# Patient Record
Sex: Male | Born: 1985 | Race: White | Hispanic: No | Marital: Married
Health system: Southern US, Community
[De-identification: ages and names within clinical notes are randomized; demographics above are authoritative.]

## PROBLEM LIST (undated history)

## (undated) DIAGNOSIS — R29898 Other symptoms and signs involving the musculoskeletal system: Secondary | ICD-10-CM

## (undated) DIAGNOSIS — I712 Thoracic aortic aneurysm, without rupture: Secondary | ICD-10-CM

## (undated) DIAGNOSIS — R519 Headache, unspecified: Secondary | ICD-10-CM

## (undated) DIAGNOSIS — K224 Dyskinesia of esophagus: Secondary | ICD-10-CM

## (undated) DIAGNOSIS — I7101 Dissection of thoracic aorta: Secondary | ICD-10-CM

## (undated) DIAGNOSIS — I71012 Dissection of descending thoracic aorta: Secondary | ICD-10-CM

## (undated) DIAGNOSIS — R51 Headache: Secondary | ICD-10-CM

## (undated) HISTORY — DX: Thoracic aortic aneurysm, without rupture: I71.2

## (undated) HISTORY — DX: Dissection of thoracic aorta: I71.01

## (undated) HISTORY — PX: COLONOSCOPY WITH ESOPHAGOGASTRODUODENOSCOPY (EGD): SHX5779

## (undated) HISTORY — DX: Headache, unspecified: R51.9

## (undated) HISTORY — DX: Dissection of descending thoracic aorta: I71.012

## (undated) HISTORY — DX: Other symptoms and signs involving the musculoskeletal system: R29.898

## (undated) HISTORY — DX: Dyskinesia of esophagus: K22.4

## (undated) HISTORY — DX: Headache: R51

---
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
## 2006-05-20 ENCOUNTER — Emergency Department (HOSPITAL_COMMUNITY): Admission: EM | Admit: 2006-05-20 | Discharge: 2006-05-20 | Payer: Self-pay | Admitting: Emergency Medicine

## 2009-01-03 HISTORY — PX: SHOULDER ARTHROSCOPY: SHX128

## 2009-05-11 ENCOUNTER — Encounter: Admission: RE | Admit: 2009-05-11 | Discharge: 2009-05-11 | Payer: Self-pay | Admitting: Cardiovascular Disease

## 2010-05-10 ENCOUNTER — Emergency Department (HOSPITAL_COMMUNITY)
Admission: EM | Admit: 2010-05-10 | Discharge: 2010-05-10 | Discharge status: Against Medical Advice | Payer: BC Managed Care – PPO | Attending: Emergency Medicine | Admitting: Emergency Medicine

## 2010-05-17 ENCOUNTER — Other Ambulatory Visit: Payer: Self-pay | Admitting: Family Medicine

## 2010-05-17 ENCOUNTER — Ambulatory Visit
Admission: RE | Admit: 2010-05-17 | Discharge: 2010-05-17 | Disposition: A | Discharge status: Home / Self Care | Payer: BC Managed Care – PPO | Source: Ambulatory Visit | Attending: Family Medicine | Admitting: Family Medicine

## 2010-05-17 MED ORDER — IOHEXOL 300 MG/ML  SOLN
100.0000 mL | Freq: Once | INTRAMUSCULAR | Status: AC | PRN
  Administered 2010-05-17: 100 mL via INTRAVENOUS

## 2010-05-22 ENCOUNTER — Emergency Department (HOSPITAL_COMMUNITY): Payer: BC Managed Care – PPO

## 2010-05-22 ENCOUNTER — Emergency Department (HOSPITAL_COMMUNITY)
Admission: EM | Admit: 2010-05-22 | Discharge: 2010-05-23 | Disposition: A | Discharge status: Home / Self Care | Payer: BC Managed Care – PPO | Attending: Emergency Medicine | Admitting: Emergency Medicine

## 2010-05-22 DIAGNOSIS — R10813 Right lower quadrant abdominal tenderness: Secondary | ICD-10-CM | POA: Insufficient documentation

## 2010-05-22 DIAGNOSIS — R112 Nausea with vomiting, unspecified: Secondary | ICD-10-CM | POA: Insufficient documentation

## 2010-05-22 DIAGNOSIS — R109 Unspecified abdominal pain: Secondary | ICD-10-CM | POA: Insufficient documentation

## 2010-05-22 LAB — BASIC METABOLIC PANEL
BUN: 6 mg/dL (ref 6–23)
CO2: 25 mEq/L (ref 19–32)
Calcium: 9.8 mg/dL (ref 8.4–10.5)
Chloride: 99 mEq/L (ref 96–112)
Creatinine, Ser: 0.85 mg/dL (ref 0.4–1.5)
GFR calc Af Amer: >60 mL/min (ref >60)
GFR calc non Af Amer: >60 mL/min (ref >60)
Glucose, Bld: 116 mg/dL — ABNORMAL HIGH (ref 70–99)
Potassium: 3.7 mEq/L (ref 3.5–5.1)
Sodium: 135 mEq/L (ref 135–145)

## 2010-05-22 LAB — URINALYSIS, ROUTINE W REFLEX MICROSCOPIC
Bilirubin Urine: NEGATIVE
Glucose, UA: NEGATIVE mg/dL
Hgb urine dipstick: NEGATIVE
Ketones, ur: 40 mg/dL — AB
Nitrite: NEGATIVE
Protein, ur: NEGATIVE mg/dL
Specific Gravity, Urine: 1.02 (ref 1.005–1.030)
Urobilinogen, UA: 0.2 mg/dL (ref 0.0–1.0)
pH: 6 (ref 5.0–8.0)

## 2010-05-22 LAB — HEPATIC FUNCTION PANEL
ALT: 25 U/L (ref 0–53)
AST: 24 U/L (ref 0–37)
Albumin: 4.5 g/dL (ref 3.5–5.2)
Alkaline Phosphatase: 101 U/L (ref 39–117)
Bilirubin, Direct: 0.1 mg/dL (ref 0.0–0.3)
Indirect Bilirubin: 0.2 mg/dL — ABNORMAL LOW (ref 0.3–0.9)
Total Bilirubin: 0.3 mg/dL (ref 0.3–1.2)
Total Protein: 8.1 g/dL (ref 6.0–8.3)

## 2010-05-22 LAB — DIFFERENTIAL
Basophils Absolute: 0 10*3/uL (ref 0.0–0.1)
Basophils Relative: 0 % (ref 0–1)
Eosinophils Absolute: 0 10*3/uL (ref 0.0–0.7)
Eosinophils Relative: 0 % (ref 0–5)
Lymphocytes Relative: 6 % — ABNORMAL LOW (ref 12–46)
Lymphs Abs: 1 10*3/uL (ref 0.7–4.0)
Monocytes Absolute: 0.3 10*3/uL (ref 0.1–1.0)
Monocytes Relative: 2 % — ABNORMAL LOW (ref 3–12)
Neutro Abs: 15.4 10*3/uL — ABNORMAL HIGH (ref 1.7–7.7)
Neutrophils Relative %: 92 % — ABNORMAL HIGH (ref 43–77)

## 2010-05-22 LAB — CBC
HCT: 40.5 % (ref 39.0–52.0)
Hemoglobin: 13 g/dL (ref 13.0–17.0)
MCH: 22.4 pg — ABNORMAL LOW (ref 26.0–34.0)
MCHC: 32.1 g/dL (ref 30.0–36.0)
MCV: 69.8 fL — ABNORMAL LOW (ref 78.0–100.0)
Platelets: 429 10*3/uL — ABNORMAL HIGH (ref 150–400)
RBC: 5.8 MIL/uL (ref 4.22–5.81)
RDW: 15.7 % — ABNORMAL HIGH (ref 11.5–15.5)
WBC: 16.7 10*3/uL — ABNORMAL HIGH (ref 4.0–10.5)

## 2010-05-22 LAB — LIPASE, BLOOD: Lipase: 30 U/L (ref 11–59)

## 2010-05-22 MED ORDER — IOHEXOL 300 MG/ML  SOLN
100.0000 mL | Freq: Once | INTRAMUSCULAR | Status: AC | PRN
  Administered 2010-05-22: 100 mL via INTRAVENOUS

## 2010-05-24 ENCOUNTER — Other Ambulatory Visit (HOSPITAL_COMMUNITY): Payer: Self-pay | Admitting: Gastroenterology

## 2010-05-24 DIAGNOSIS — R112 Nausea with vomiting, unspecified: Secondary | ICD-10-CM

## 2010-05-25 LAB — GC/CHLAMYDIA PROBE AMP, GENITAL
Chlamydia, DNA Probe: NEGATIVE
GC Probe Amp, Genital: NEGATIVE

## 2010-06-02 LAB — HM COLONOSCOPY

## 2010-06-04 ENCOUNTER — Encounter (HOSPITAL_COMMUNITY)
Admission: RE | Admit: 2010-06-04 | Discharge: 2010-06-04 | Disposition: A | Discharge status: Home / Self Care | Payer: BC Managed Care – PPO | Source: Ambulatory Visit | Attending: Gastroenterology | Admitting: Gastroenterology

## 2010-06-04 DIAGNOSIS — R1013 Epigastric pain: Secondary | ICD-10-CM | POA: Insufficient documentation

## 2010-06-04 DIAGNOSIS — K3189 Other diseases of stomach and duodenum: Secondary | ICD-10-CM | POA: Insufficient documentation

## 2010-06-04 DIAGNOSIS — R112 Nausea with vomiting, unspecified: Secondary | ICD-10-CM

## 2010-06-04 MED ORDER — TECHNETIUM TC 99M SULFUR COLLOID
2.0000 | Freq: Once | INTRAVENOUS | Status: AC | PRN
  Administered 2010-06-04: 2 via INTRAVENOUS

## 2010-11-29 ENCOUNTER — Other Ambulatory Visit: Payer: Self-pay | Admitting: Family Medicine

## 2010-11-29 ENCOUNTER — Ambulatory Visit
Admission: RE | Admit: 2010-11-29 | Discharge: 2010-11-29 | Disposition: A | Discharge status: Home / Self Care | Payer: BC Managed Care – PPO | Source: Ambulatory Visit | Attending: Family Medicine | Admitting: Family Medicine

## 2010-11-29 DIAGNOSIS — M25511 Pain in right shoulder: Secondary | ICD-10-CM

## 2011-05-26 ENCOUNTER — Encounter: Payer: Self-pay | Admitting: Cardiology

## 2015-02-21 DIAGNOSIS — Y998 Other external cause status: Secondary | ICD-10-CM | POA: Insufficient documentation

## 2015-02-21 DIAGNOSIS — S93402A Sprain of unspecified ligament of left ankle, initial encounter: Secondary | ICD-10-CM | POA: Insufficient documentation

## 2015-02-21 DIAGNOSIS — Y9289 Other specified places as the place of occurrence of the external cause: Secondary | ICD-10-CM | POA: Diagnosis not present

## 2015-02-21 DIAGNOSIS — Z8679 Personal history of other diseases of the circulatory system: Secondary | ICD-10-CM | POA: Diagnosis not present

## 2015-02-21 DIAGNOSIS — X501XXA Overexertion from prolonged static or awkward postures, initial encounter: Secondary | ICD-10-CM | POA: Insufficient documentation

## 2015-02-21 DIAGNOSIS — Y9389 Activity, other specified: Secondary | ICD-10-CM | POA: Insufficient documentation

## 2015-02-21 DIAGNOSIS — S99912A Unspecified injury of left ankle, initial encounter: Secondary | ICD-10-CM | POA: Diagnosis present

## 2015-02-21 NOTE — ED Notes (Signed)
Patient presents with left ankle swollen.  Patient stated he missed a couple of steps and hurt his ankle  Ice applied in triage

## 2015-02-22 ENCOUNTER — Encounter (HOSPITAL_COMMUNITY): Payer: Self-pay | Admitting: *Deleted

## 2015-02-22 ENCOUNTER — Emergency Department (HOSPITAL_COMMUNITY): Payer: BLUE CROSS/BLUE SHIELD

## 2015-02-22 ENCOUNTER — Emergency Department (HOSPITAL_COMMUNITY)
Admission: EM | Admit: 2015-02-22 | Discharge: 2015-02-22 | Disposition: A | Discharge status: Home / Self Care | Payer: BLUE CROSS/BLUE SHIELD | Attending: Emergency Medicine | Admitting: Emergency Medicine

## 2015-02-22 DIAGNOSIS — S93402A Sprain of unspecified ligament of left ankle, initial encounter: Secondary | ICD-10-CM

## 2015-02-22 MED ORDER — IBUPROFEN 100 MG/5ML PO SUSP: 600.0000 mg | Freq: Four times a day (QID) | ORAL | Status: AC | PRN

## 2015-02-22 MED ORDER — IBUPROFEN 100 MG/5ML PO SUSP
600.0000 mg | Freq: Once | ORAL | Status: AC
  Administered 2015-02-22: 600 mg via ORAL
  Filled 2015-02-22: qty 30

## 2015-02-22 NOTE — Discharge Instructions (Signed)
Ankle Sprain °An ankle sprain is an injury to the strong, fibrous tissues (ligaments) that hold the bones of your ankle joint together.  °CAUSES °An ankle sprain is usually caused by a fall or by twisting your ankle. Ankle sprains most commonly occur when you step on the outer edge of your foot, and your ankle turns inward. People who participate in sports are more prone to these types of injuries.  °SYMPTOMS  °· Pain in your ankle. The pain may be present at rest or only when you are trying to stand or walk. °· Swelling. °· Bruising. Bruising may develop immediately or within 1 to 2 days after your injury. °· Difficulty standing or walking, particularly when turning corners or changing directions. °DIAGNOSIS  °Your caregiver will ask you details about your injury and perform a physical exam of your ankle to determine if you have an ankle sprain. During the physical exam, your caregiver will press on and apply pressure to specific areas of your foot and ankle. Your caregiver will try to move your ankle in certain ways. An X-ray exam may be done to be sure a bone was not broken or a ligament did not separate from one of the bones in your ankle (avulsion fracture).  °TREATMENT  °Certain types of braces can help stabilize your ankle. Your caregiver can make a recommendation for this. Your caregiver may recommend the use of medicine for pain. If your sprain is severe, your caregiver may refer you to a surgeon who helps to restore function to parts of your skeletal system (orthopedist) or a physical therapist. °HOME CARE INSTRUCTIONS  °· Apply ice to your injury for 1-2 days or as directed by your caregiver. Applying ice helps to reduce inflammation and pain. °· Put ice in a plastic bag. °· Place a towel between your skin and the bag. °· Leave the ice on for 15-20 minutes at a time, every 2 hours while you are awake. °· Only take over-the-counter or prescription medicines for pain, discomfort, or fever as directed by  your caregiver. °· Elevate your injured ankle above the level of your heart as much as possible for 2-3 days. °· If your caregiver recommends crutches, use them as instructed. Gradually put weight on the affected ankle. Continue to use crutches or a cane until you can walk without feeling pain in your ankle. °· If you have a plaster splint, wear the splint as directed by your caregiver. Do not rest it on anything harder than a pillow for the first 24 hours. Do not put weight on it. Do not get it wet. You may take it off to take a shower or bath. °· You may have been given an elastic bandage to wear around your ankle to provide support. If the elastic bandage is too tight (you have numbness or tingling in your foot or your foot becomes cold and blue), adjust the bandage to make it comfortable. °· If you have an air splint, you may blow more air into it or let air out to make it more comfortable. You may take your splint off at night and before taking a shower or bath. Wiggle your toes in the splint several times per day to decrease swelling. °SEEK MEDICAL CARE IF:  °· You have rapidly increasing bruising or swelling. °· Your toes feel extremely cold or you lose feeling in your foot. °· Your pain is not relieved with medicine. °SEEK IMMEDIATE MEDICAL CARE IF: °· Your toes are numb or blue. °·   You have severe pain that is increasing. °MAKE SURE YOU:  °· Understand these instructions. °· Will watch your condition. °· Will get help right away if you are not doing well or get worse. °  °This information is not intended to replace advice given to you by your health care provider. Make sure you discuss any questions you have with your health care provider. °  °Document Released: 12/20/2004 Document Revised: 01/10/2014 Document Reviewed: 01/01/2011 °Elsevier Interactive Patient Education ©2016 [employer] °RICE for Routine Care of Injuries °Many injuries can be cared for using rest, ice, compression, and elevation (RICE  therapy). Using RICE therapy can help to lessen pain and swelling. It can help your body to heal. °Rest °Reduce your normal activities and avoid using the injured part of your body. You can go back to your normal activities when you feel okay and your doctor says it is okay. °Ice °Do not put ice on your bare skin. °· Put ice in a plastic bag. °· Place a towel between your skin and the bag. °· Leave the ice on for 20 minutes, 2-3 times a day. °Do this for as long as told by your doctor. °Compression °Compression means putting pressure on the injured area. This can be done with an elastic bandage. If an elastic bandage has been applied: °· Remove and reapply the bandage every 3-4 hours or as told by your doctor. °· Make sure the bandage is not wrapped too tight. Wrap the bandage more loosely if part of your body beyond the bandage is blue, swollen, cold, painful, or loses feeling (numb). °· See your doctor if the bandage seems to make your problems worse. °Elevation °Elevation means keeping the injured area raised. Raise the injured area above your heart or the center of your chest if you can. °WHEN SHOULD I GET HELP? °You should get help if: °· You keep having pain and swelling. °· Your symptoms get worse. °WHEN SHOULD I GET HELP RIGHT AWAY? °You should get help right away if: °· You have sudden bad pain at or below the area of your injury. °· You have redness or more swelling around your injury. °· You have tingling or numbness at or below the injury that does not go away when you take off the bandage. °  °This information is not intended to replace advice given to you by your health care provider. Make sure you discuss any questions you have with your health care provider. °  °Document Released: 06/08/2007 Document Revised: 09/10/2014 Document Reviewed: 11/27/2013 °Elsevier Interactive Patient Education ©2016 [employer] ° °

## 2015-02-22 NOTE — ED Notes (Signed)
Patient is alert and orientedx4.  Patient was explained discharge instructions and they understood them with no questions.  The patient's wife, Hayk Divis is taking the patient home.

## 2015-02-22 NOTE — ED Provider Notes (Signed)
CSN: [id number]     Arrival date & time 02/21/15  2352 History   First MD Initiated Contact with Patient 02/22/15 0058     Chief Complaint  Patient presents with  . Ankle Injury     (Consider location/radiation/quality/duration/timing/severity/associated sxs/prior Treatment) Patient is a 30 y.o. male presenting with foot injury.  Foot Injury Location:  Ankle Time since incident: a few hours ago. Injury: yes   Mechanism of injury comment:  "missed a couple steps" twisting ankle Ankle location:  L ankle Pain details:    Quality:  Throbbing   Radiates to:  Does not radiate   Severity:  Mild   Onset quality:  Sudden   Timing:  Constant   Progression:  Waxing and waning Chronicity:  New Relieved by:  Nothing Worsened by:  Bearing weight Ineffective treatments:  Ice Associated symptoms: swelling   Associated symptoms: no decreased ROM, no fever, no muscle weakness, no numbness and no tingling   Risk factors: no frequent fractures and no known bone disorder     Past Medical History  Diagnosis Date  . 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 Left arm weakness   . Generalized headaches   . 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 Aortic transection     Family History  Problem Relation Age of Onset  . Hypertension Father   . Hyperlipidemia Mother    Social History  Substance Use Topics  . Smoking status: Never Smoker   . Smokeless tobacco: Never Used  . Alcohol Use: Yes     Comment: Social.     Review of Systems  Constitutional: Negative for fever.  Musculoskeletal: Positive for joint swelling and arthralgias.  All other systems reviewed and are negative.   Allergies  Review of patient's allergies indicates no known allergies.  Home Medications   Prior to Admission medications   Medication Sig Start Date End Date Taking? Authorizing Provider  ibuprofen (CHILD IBUPROFEN) 100 MG/5ML  suspension Take 30 mLs (600 mg total) by mouth every 6 (six) hours as needed for mild pain or moderate pain. 02/22/15   Antony Madura, PA-C  Multiple Vitamins-Minerals (MULTIVITAMIN PO) Take 1 tablet by mouth as needed.    Historical Provider, MD   BP 139/76 mmHg  Pulse 94  Temp(Src) 98.9 F (37.2 C) (Oral)  Resp 18  Ht  (1.727 m)  Wt 84.823 kg  BMI 28.44 kg/m2  SpO2 96%   Physical Exam  Constitutional: He is oriented to person, place, and time. He appears well-developed and well-nourished. No distress.  Nontoxic/nonseptic appearing  HENT:  Head: Normocephalic and atraumatic.  Eyes: Conjunctivae and EOM are normal. No scleral icterus.  Neck: Normal range of motion.  Cardiovascular: Normal rate, regular rhythm and intact distal pulses.   DP and PT pulses 2+ in the LLE. Capillary refill brisk in all digits of L foot.  Pulmonary/Chest: Effort normal. No respiratory distress.  Musculoskeletal: Normal range of motion.       Left ankle: He exhibits swelling. He exhibits normal range of motion, no deformity and normal pulse. Tenderness. Lateral malleolus tenderness found. Achilles tendon normal.       Feet:  Neurological: He is alert and oriented to person, place, and time. He exhibits normal muscle tone. Coordination normal.  Skin: Skin is warm and dry. No rash noted. He is not diaphoretic. No erythema. No pallor.  Psychiatric: He has a normal  mood and affect. His behavior is normal.  Nursing note and vitals reviewed.   ED Course  Procedures (including critical care time) Labs Review Labs Reviewed - No data to display  Imaging Review Dg Ankle 2 Views Left  02/22/2015  CLINICAL DATA:  Patient tripped on steps and hurt the left ankle. Swelling of the left ankle. EXAM: LEFT ANKLE - 2 VIEW COMPARISON:  None. FINDINGS: Soft tissue swelling predominate about the lateral malleolus. No evidence of acute fracture or dislocation demonstrated on two view study. No focal bone lesions. No  radiopaque soft tissue foreign bodies. IMPRESSION: Lateral soft tissue swelling.  No acute fractures demonstrated. Electronically Signed   By: Burman Nieves M.D.   On: 02/22/2015 00:54   I have personally reviewed and evaluated these images and lab results as part of my medical decision-making.   EKG Interpretation None      MDM   Final diagnoses:  Ankle sprain, left, initial encounter    30 year old male patient to the ED for evaluation of left ankle pain after twisting his ankle. Patient smells of alcohol. He is neurovascularly intact. X-ray negative for fracture. Patient placed in ASO ankle brace and given crutches for WBAT. Have advised the use of NSAIDs and icing as well as elevation. Orthopedic referral provided should pain persist despite supportive care. Return precautions given at discharge. Patient agreeable to plan with known address concerns; discharged in good condition in the care of sober male.   Filed Vitals:   02/21/15 2359 02/22/15 0137  BP: 124/70 139/76  Pulse: 67 94  Temp: 98.9 F (37.2 C) 98.9 F (37.2 C)  TempSrc: Oral Oral  Resp: 20 18  Height:  (1.727 m)   Weight: 84.823 kg   SpO2: 100% 96%     Antony Madura, PA-C 02/22/15 0981  Shon Baton, MD 02/22/15 2302

## 2016-11-10 ENCOUNTER — Other Ambulatory Visit (INDEPENDENT_AMBULATORY_CARE_PROVIDER_SITE_OTHER): Payer: Self-pay

## 2016-11-10 MED ORDER — METHYLPREDNISOLONE 4 MG PO TABS: ORAL_TABLET | ORAL | 0 refills | Status: DC

## 2016-12-05 ENCOUNTER — Encounter (INDEPENDENT_AMBULATORY_CARE_PROVIDER_SITE_OTHER): Payer: Self-pay | Admitting: Physician Assistant

## 2016-12-05 ENCOUNTER — Ambulatory Visit (INDEPENDENT_AMBULATORY_CARE_PROVIDER_SITE_OTHER): Payer: BLUE CROSS/BLUE SHIELD

## 2016-12-05 ENCOUNTER — Ambulatory Visit (INDEPENDENT_AMBULATORY_CARE_PROVIDER_SITE_OTHER): Payer: BLUE CROSS/BLUE SHIELD | Admitting: Physician Assistant

## 2016-12-05 VITALS — Ht 67.0 in | Wt 180.0 lb

## 2016-12-05 DIAGNOSIS — M542 Cervicalgia: Secondary | ICD-10-CM | POA: Diagnosis not present

## 2016-12-05 DIAGNOSIS — R030 Elevated blood-pressure reading, without diagnosis of hypertension: Secondary | ICD-10-CM

## 2016-12-05 NOTE — Progress Notes (Signed)
Office Visit Note   Patient: Timothy Gardner           Date of Birth: 04/02/1985           MRN: [id number] Visit Date: 12/05/2016              Requested by: No referring provider defined for this encounter. PCP: Patient, No Pcp Per   Assessment & Plan: Visit Diagnoses:  1. Neck pain   2. Elevated blood pressure reading     Plan: Discussed with him at length the need for him to seek out a primary care physician for treatment of his hypertension.  Discussed with him this may be the source of his headache-like pain and possibly even his blurred vision.  Discussed risk including renal disease, stroke and heart disease.   Follow-Up Instructions: Return if symptoms worsen or fail to improve.   Orders:  Orders Placed This Encounter  Procedures  . XR Cervical Spine 2 or 3 views   No orders of the defined types were placed in this encounter.     Procedures: No procedures performed   Clinical Data: No additional findings.   Subjective: Chief Complaint  Patient presents with  . Neck - Pain    HPI [name] is a 31 year old male comes in today due to posterior neck and lateral head pain.  He notices that if he lays on his back and neck arches his neck forward under car to work on it that his vision goes blurry.  This happens at least 2 accounts.  He has some pain lateral aspect of his his head and neck that is so severe it causes him to vomit at times.  He notes that his neck is stiff at times he cannot turn his neck to the right.  He has had no injury to the neck or his head.  He is tried some ibuprofen which helps some with his pain.  His last eye exam was years ago. Review of Systems Denies any dizziness, loss of consciousness, radicular symptoms down either arm, fevers or chills.  Objective: Vital Signs: Ht 5\' 7"  (1.702 m)   Wt 180 lb (81.6 kg)   BMI 28.19 kg/m  Blood pressure: 153/83 HR: 75  Physical Exam  Constitutional: He is oriented to person, place, and time. He  appears well-developed and well-nourished. No distress.  Eyes: EOM are normal. Pupils are equal, round, and reactive to light.  Pulmonary/Chest: Effort normal.  Neurological: He is alert and oriented to person, place, and time. No cranial nerve deficit.  Skin: He is not diaphoretic.  Psychiatric: He has a normal mood and affect.    Ortho Exam Cervical spine full range of motion without pain.  Negative Spurling's.  He is nontender about the cervical spine and the medial borders of both scapula.  5 out of 5 strength throughout the upper extremities against resistance.  Full sensation bilateral hands and full motor bilateral hands. Specialty Comments:  No specialty comments available.  Imaging: Xr Cervical Spine 2 Or 3 Views  Result Date: 12/05/2016  Cervical spine AP lateral views: No acute fractures.  No spondylolisthesis.  Disc space well maintained.  Normal lordotic curvature maintained.  No bony abnormalities.    PMFS History: Patient Active Problem List   Diagnosis Date Noted  . Neck pain 12/05/2016  . Elevated blood pressure reading 12/05/2016   Past Medical History:  Diagnosis Date  . Aortic arch pseudoaneurysm (HCC)   . Dissection of descending thoracic aorta (  HCC)    Traumatic  . Generalized headaches   . Left arm weakness     Family History  Problem Relation Age of Onset  . Hypertension Father   . Hyperlipidemia Mother     Past Surgical History:  Procedure Laterality Date  . aortic transection    . SHOULDER ARTHROSCOPY     Social History   Occupational History  . Not on file  Tobacco Use  . Smoking status: Never Smoker  . Smokeless tobacco: Never Used  Substance and Sexual Activity  . Alcohol use: Yes    Comment: Social.   . Drug use: Not on file  . Sexual activity: Not on file

## 2016-12-07 ENCOUNTER — Ambulatory Visit (INDEPENDENT_AMBULATORY_CARE_PROVIDER_SITE_OTHER): Payer: BLUE CROSS/BLUE SHIELD | Admitting: Medical

## 2016-12-07 ENCOUNTER — Encounter: Payer: Self-pay | Admitting: Medical

## 2016-12-07 ENCOUNTER — Ambulatory Visit: Payer: BLUE CROSS/BLUE SHIELD | Admitting: Medical

## 2016-12-07 VITALS — BP 142/90 | HR 83 | Ht 67.5 in | Wt 186.6 lb

## 2016-12-07 DIAGNOSIS — Z23 Encounter for immunization: Secondary | ICD-10-CM

## 2016-12-07 DIAGNOSIS — R519 Headache, unspecified: Secondary | ICD-10-CM

## 2016-12-07 DIAGNOSIS — M542 Cervicalgia: Secondary | ICD-10-CM | POA: Diagnosis not present

## 2016-12-07 DIAGNOSIS — R51 Headache: Secondary | ICD-10-CM

## 2016-12-07 DIAGNOSIS — H538 Other visual disturbances: Secondary | ICD-10-CM

## 2016-12-07 DIAGNOSIS — R030 Elevated blood-pressure reading, without diagnosis of hypertension: Secondary | ICD-10-CM
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
## 2016-12-08 NOTE — Addendum Note (Signed)
Addended by: Jac CanavanYSINGER, DAVID S on: 12/08/2016 12:29 PM   Modules accepted: Orders

## 2017-02-19 DIAGNOSIS — J0100 Acute maxillary sinusitis, unspecified: Secondary | ICD-10-CM | POA: Diagnosis not present

## 2017-02-22 ENCOUNTER — Encounter: Payer: Self-pay | Admitting: Medical

## 2017-02-22 ENCOUNTER — Ambulatory Visit (INDEPENDENT_AMBULATORY_CARE_PROVIDER_SITE_OTHER): Payer: BLUE CROSS/BLUE SHIELD | Admitting: Medical

## 2017-02-22 VITALS — BP 152/88 | HR 84 | Temp 98.2°F | Ht 67.5 in | Wt 185.6 lb

## 2017-02-22 DIAGNOSIS — Z7189 Other specified counseling: Secondary | ICD-10-CM | POA: Diagnosis not present

## 2017-02-22 DIAGNOSIS — Z8679 Personal history of other diseases of the circulatory system: Secondary | ICD-10-CM | POA: Diagnosis not present

## 2017-02-22 DIAGNOSIS — Z0000 Encounter for general adult medical examination without abnormal findings: Secondary | ICD-10-CM

## 2017-02-22 DIAGNOSIS — K224 Dyskinesia of esophagus: Secondary | ICD-10-CM | POA: Diagnosis not present

## 2017-02-22 DIAGNOSIS — I10 Essential (primary) hypertension: Secondary | ICD-10-CM | POA: Diagnosis not present

## 2017-02-22 DIAGNOSIS — K219 Gastro-esophageal reflux disease without esophagitis: Secondary | ICD-10-CM | POA: Insufficient documentation

## 2017-02-22 DIAGNOSIS — Z1271 Encounter for screening for malignant neoplasm of testis: Secondary | ICD-10-CM | POA: Insufficient documentation

## 2017-02-22 DIAGNOSIS — Z7185 Encounter for immunization safety counseling: Secondary | ICD-10-CM

## 2017-02-22 MED ORDER — LISINOPRIL 10 MG PO TABS: 10.0000 mg | ORAL_TABLET | Freq: Every day | ORAL | 2 refills | Status: DC
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
## 2017-02-23 LAB — CBC WITH DIFFERENTIAL/PLATELET
Basophils Absolute: 0 10*3/uL (ref 0.0–0.2)
Basos: 0 %
EOS (ABSOLUTE): 0.1 10*3/uL (ref 0.0–0.4)
Eos: 1 %
Hematocrit: 39.4 % (ref 37.5–51.0)
Hemoglobin: 12.6 g/dL — ABNORMAL LOW (ref 13.0–17.7)
Immature Grans (Abs): 0.1 10*3/uL (ref 0.0–0.1)
Immature Granulocytes: 1 %
Lymphocytes Absolute: 2.4 10*3/uL (ref 0.7–3.1)
Lymphs: 25 %
MCH: 21.8 pg — ABNORMAL LOW (ref 26.6–33.0)
MCHC: 32 g/dL (ref 31.5–35.7)
MCV: 68 fL — ABNORMAL LOW (ref 79–97)
Monocytes Absolute: 0.7 10*3/uL (ref 0.1–0.9)
Monocytes: 7 %
Neutrophils Absolute: 6.3 10*3/uL (ref 1.4–7.0)
Neutrophils: 66 %
Platelets: 401 10*3/uL — ABNORMAL HIGH (ref 150–379)
RBC: 5.78 x10E6/uL (ref 4.14–5.80)
RDW: 17 % — ABNORMAL HIGH (ref 12.3–15.4)
WBC: 9.7 10*3/uL (ref 3.4–10.8)

## 2017-02-23 LAB — COMPREHENSIVE METABOLIC PANEL
ALT: 69 IU/L — ABNORMAL HIGH (ref 0–44)
AST: 37 IU/L (ref 0–40)
Albumin/Globulin Ratio: 1.5 (ref 1.2–2.2)
Albumin: 4.6 g/dL (ref 3.5–5.5)
Alkaline Phosphatase: 92 IU/L (ref 39–117)
BUN/Creatinine Ratio: 10 (ref 9–20)
BUN: 9 mg/dL (ref 6–20)
Bilirubin Total: 0.3 mg/dL (ref 0.0–1.2)
CO2: 24 mmol/L (ref 20–29)
Calcium: 9.8 mg/dL (ref 8.7–10.2)
Chloride: 100 mmol/L (ref 96–106)
Creatinine, Ser: 0.86 mg/dL (ref 0.76–1.27)
GFR calc Af Amer: 135 mL/min/{1.73_m2} (ref >59)
GFR calc non Af Amer: 116 mL/min/{1.73_m2} (ref >59)
Globulin, Total: 3 g/dL (ref 1.5–4.5)
Glucose: 83 mg/dL (ref 65–99)
Potassium: 4.9 mmol/L (ref 3.5–5.2)
Sodium: 140 mmol/L (ref 134–144)
Total Protein: 7.6 g/dL (ref 6.0–8.5)

## 2017-02-23 LAB — TSH: TSH: 1.45 u[IU]/mL (ref 0.450–4.500)

## 2017-02-23 LAB — HEMOGLOBIN A1C
Est. average glucose Bld gHb Est-mCnc: 114 mg/dL
Hgb A1c MFr Bld: 5.6 % (ref 4.8–5.6)

## 2017-02-23 LAB — LIPID PANEL
Chol/HDL Ratio: 2.7 ratio (ref 0.0–5.0)
Cholesterol, Total: 124 mg/dL (ref 100–199)
HDL: 46 mg/dL (ref >39)
LDL Calculated: 61 mg/dL (ref 0–99)
Triglycerides: 84 mg/dL (ref 0–149)
VLDL Cholesterol Cal: 17 mg/dL (ref 5–40)

## 2017-02-27 ENCOUNTER — Encounter: Payer: Self-pay | Admitting: Medical

## 2017-03-23 ENCOUNTER — Other Ambulatory Visit: Payer: Self-pay | Admitting: Medical

## 2017-03-23 ENCOUNTER — Telehealth: Payer: Self-pay | Admitting: Medical

## 2017-03-23 ENCOUNTER — Encounter: Payer: Self-pay | Admitting: Medical

## 2017-03-23 ENCOUNTER — Ambulatory Visit (INDEPENDENT_AMBULATORY_CARE_PROVIDER_SITE_OTHER): Payer: BLUE CROSS/BLUE SHIELD | Admitting: Medical

## 2017-03-23 VITALS — BP 144/84 | HR 75 | Temp 97.6°F | Ht 66.0 in | Wt 188.4 lb

## 2017-03-23 DIAGNOSIS — R7989 Other specified abnormal findings of blood chemistry: Secondary | ICD-10-CM

## 2017-03-23 DIAGNOSIS — D649 Anemia, unspecified: Secondary | ICD-10-CM | POA: Diagnosis not present

## 2017-03-23 DIAGNOSIS — Z8679 Personal history of other diseases of the circulatory system: Secondary | ICD-10-CM | POA: Diagnosis not present

## 2017-03-23 DIAGNOSIS — D509 Iron deficiency anemia, unspecified: Secondary | ICD-10-CM | POA: Insufficient documentation

## 2017-03-23 DIAGNOSIS — R945 Abnormal results of liver function studies: Secondary | ICD-10-CM

## 2017-03-23 DIAGNOSIS — R05 Cough: Secondary | ICD-10-CM

## 2017-03-23 DIAGNOSIS — T464X5A Adverse effect of angiotensin-converting-enzyme inhibitors, initial encounter: Secondary | ICD-10-CM

## 2017-03-23 DIAGNOSIS — I10 Essential (primary) hypertension: Secondary | ICD-10-CM | POA: Diagnosis not present

## 2017-03-23 DIAGNOSIS — R058 Other specified cough: Secondary | ICD-10-CM

## 2017-03-23 MED ORDER — OLMESARTAN MEDOXOMIL 20 MG PO TABS: 20.0000 mg | ORAL_TABLET | Freq: Every day | ORAL | 1 refills | Status: DC

## 2017-03-23 MED ORDER — AZILSARTAN MEDOXOMIL 40 MG PO TABS: 1.0000 | ORAL_TABLET | Freq: Every day | ORAL | 3 refills | Status: DC

## 2017-03-23 NOTE — Patient Instructions (Addendum)
Recommendations:  Mild anemia  Begin a daily multivitamin  Return in 2-3 months for additional lab work  Elevated liver tests  Cut back on alcohol and fast food, fried food and junk food  Plan to recheck in 2-3 months for nurse visit for lab work  Hypertension (high blood pressure):  Your blood pressure in under fair  control.  Evidence of organ damage due to hypertension: none  Medication: discontinue Lisinopril due to cough and begin Olmesartan 20mg  daily in the morning.  Specific recommendations:  Exercise regularly, preferably every day with aerobic exercise for 30 minutes or more  Avoid added salt in the diet.   Follow a DASH eating plan or Mediterranean eating plan  Check blood pressures 2 times weekly and record.   Hypertension, commonly called high blood pressure, is when the force of blood pumping through your arteries is too strong. Your arteries are the blood vessels that carry blood from your heart throughout your body. A blood pressure reading consists of a higher number over a lower number, such as 110/72. The higher number (systolic) is the pressure inside your arteries when your heart pumps. The lower number (diastolic) is the pressure inside your arteries when your heart relaxes. Ideally you want your blood pressure below 120/80. Hypertension forces your heart to work harder to pump blood. Your arteries may become narrow or stiff. Having hypertension puts you at risk for heart disease, stroke, and other problems.  RISK FACTORS Some risk factors for high blood pressure are controllable. Others are not.  Risk factors you cannot control include:   Race. You may be at higher risk if you are African American.  Age. Risk increases with age.  Gender. Men are at higher risk than women before age 70 years. After age 32, women are at higher risk than men. Risk factors you can control include:  Not getting enough exercise or physical activity.  Being  overweight.  Getting too much fat, sugar, calories, or salt in your diet.  Drinking too much alcohol. SIGNS AND SYMPTOMS Hypertension does not usually cause signs or symptoms. Extremely high blood pressure (hypertensive crisis) may cause headache, anxiety, shortness of breath, and nosebleed. DIAGNOSIS  To check if you have hypertension, your health care provider will measure your blood pressure while you are seated, with your arm held at the level of your heart. It should be measured at least twice using the same arm. Certain conditions can cause a difference in blood pressure between your right and left arms. A blood pressure reading that is higher than normal on one occasion does not mean that you need treatment. If one blood pressure reading is high, ask your health care provider about having it checked again. BLOOD PRESSURE STAGES Blood pressure is classified into four stages: normal, prehypertension, stage 1, and stage 2. Your blood pressure reading will be used to determine what type of treatment, if any, is necessary. Appropriate treatment options are tied to these four stages:  Normal  Systolic pressure (mm Hg): below 120.  Diastolic pressure (mm Hg): below 80. Prehypertension  Systolic pressure (mm Hg): 120 to 139.  Diastolic pressure (mm Hg): 80 to 89. Stage1  Systolic pressure (mm Hg): 140 to 159.  Diastolic pressure (mm Hg): 90 to 99. Stage2  Systolic pressure (mm Hg): 160 or above.  Diastolic pressure (mm Hg): 100 or above. RISKS RELATED TO HIGH BLOOD PRESSURE Managing your blood pressure is an important responsibility. Uncontrolled high blood pressure can lead to:  A  heart attack.  A stroke.  A weakened blood vessel (aneurysm).  Heart failure.  Kidney damage.  Eye damage.  Metabolic syndrome.  Memory and concentration problems. TREATMENT  Treating high blood pressure includes making lifestyle changes and possibly taking medicine. Living a healthy  lifestyle can help lower high blood pressure. You may need to change some of your habits. Lifestyle changes may include:  Following the DASH diet. This diet is high in fruits, vegetables, and whole grains. It is low in salt, red meat, and added sugars.  Getting at least 2 hours of brisk physical activity every week.  Losing weight if necessary.  Not smoking.  Limiting alcoholic beverages.  Learning ways to reduce stress. If lifestyle changes are not enough to get your blood pressure under control, your health care provider may prescribe medicine. You may need to take more than one. Work closely with your health care provider to understand the risks and benefits. HOME CARE INSTRUCTIONS  Have your blood pressure rechecked as directed by your health care provider.   Take medicines only as directed by your health care provider. Follow the directions carefully. Blood pressure medicines must be taken as prescribed. The medicine does not work as well when you skip doses. Skipping doses also puts you at risk for problems.   Do not smoke.   Monitor your blood pressure at home as directed by your health care provider. SEEK MEDICAL CARE IF:   You think you are having a reaction to medicines taken.  You have recurrent headaches or feel dizzy.  You have swelling in your ankles.  You have trouble with your vision. SEEK IMMEDIATE MEDICAL CARE IF:  You develop a severe headache or confusion.  You have unusual weakness, numbness, or feel faint.  You have severe chest or abdominal pain.  You vomit repeatedly.  You have trouble breathing. MAKE SURE YOU:   Understand these instructions.  Will watch your condition.  Will get help right away if you are not doing well or get worse. Document Released: 12/20/2004 Document Revised: 05/06/2013 Document Reviewed: 10/12/2012 [facility] Patient Information 2015 [place], [place]. This information is not intended to replace advice given to you  by your health care provider. Make sure you discuss any questions you have with your health care provider.   Liver Function Tests Liver function tests are blood tests to see how well your liver is working. The proteins and enzymes measured in the test can alert your health care provider to inflammation, damage, or disease in your liver. It is common to have liver function tests:  During annual physical exams.  When you are taking certain medicines.  If you have liver disease.  If you drink a lot of alcohol.  When you are not feeling well.  When you have other conditions that may affect the liver.  Substances measured may include:  Alanine transaminase (ALT). This is an enzyme in the liver.  Aspartate transaminase (AST). This is an enzyme in the liver, heart, and muscles.  Alkaline phosphatase (ALP). This is a protein in the liver, bile ducts, and bone. It is also in other body tissues.  Total bilirubin. This is a yellow pigment in bile.  Albumin. This is a protein in the liver.  Prothrombin time and international normalized ratio (PT and INR). PT measures the time that it takes for your blood to clot. INR is a calculation of blood clotting time based upon your PT result. It is also calculated based on normal ranges defined  by the laboratory that processed your lab test.  Total protein. This measures two proteins, albumin and globulin, found in the blood.  How do I prepare for this test? How you prepare will depend on which tests are being done and the reason why these tests are being done. You may need to:  Avoid eating for 4-6 hours before the test or as directed by your health care provider.  Stop taking certain medicines prior to your blood test as directed by your health care provider.  What do the results mean? It is your responsibility to obtain your test results. Ask the lab or department performing the test when and how you will get your results. Contact your health  care provider to discuss any questions you have about your results. RANGE OF NORMAL VALUES Ranges for normal values may vary among different labs and hospitals. You should always check with your health care provider after having lab work or other tests done to discuss the meaning of your test results and whether your values are considered within normal limits. The following are normal ranges for substances measured in liver function tests: ALT  Infant: may be twice as high as adult values.  Child or adult: 4-36 international units/L at 37C or 4-36 units/L (SI units).  Elderly: may be slightly higher than adult values. AST  Newborn 21-6 days old: 35-140 units/L.  Child under 14 years old: 15-60 units/L.  1-28 years old: 15-50 units/L.  36-39 years old: 10-50 units/L.  71-50 years old: 10-40 units/L.  Adult: 0-35 units/L or 0-0.58 microkatal/L (SI units).  Elderly: slightly higher than adults. ALP  Child under 41 years old: 85-235 units/L.  45-62 years old: 65-210 units/L.  66-86 years old: 60-300 units/L.  34-99 years old: 30-200 units/L.  Adult: 30-120 units/L or 0.5-2.0 microkatal/L (SI units).  Elderly: slightly higher than adult. Total bilirubin  Newborn: 1.0-12.0 mg/dL or 16.1-096 micromoles/L (SI units).  Adult, elderly, or child: 0.3-1.0 mg/dL or 0.4-54 micromoles/L. Albumin  Premature infant: 3.0-4.2 g/dL.  Newborn: 3.5-5.4 g/dL.  Infant: 4.4-5.4 g/dL.  Child: 4.0-5.9 g/dL.  Adult or elderly: 3.5-5.0 g/dL or 09-81 g/L (SI units). PT  11.0-12.5 seconds; 85%-100%. INR  0.8-1.1. Total protein  Premature infant: 4.2-7.6 g/dL.  Newborn: 4.6-7.4 g/dL.  Infant: 6.0-6.7 g/dL.  Child: 6.2-8.0 g/dL.  Adult or elderly: 6.4-8.3 g/dL or 19-14 g/L (SI units). MEANING OF RESULTS OUTSIDE NORMAL VALUE RANGES Sometimes test results can be abnormal due to other factors, such as medicines, exercise, or pregnancy. Follow up with your health care provider if you  have any questions about test results outside the normal value ranges. ALT  Levels above the normal range, along with other test results, may indicate liver disease. AST  Levels above the normal range, along with other test results, may indicate liver disease. Sometimes levels also increase after burns, surgery, heart attack, muscle damage, or seizure. ALP  Levels above the normal range, along with other test results, may indicate biliary obstruction, diseases of the liver, bone disease, thyroid disease, tumors, fractures, leukemia or lymphoma, or several other conditions. People with blood type O or B may show higher levels after a fatty meal.  Levels below the normal range, along with other test results, may indicate bone and teeth conditions, malnutrition, protein deficiency, or Wilson disease. Total bilirubin  Levels above the normal range, along with other test results, may indicate problems with the liver, gallbladder, or bile ducts. Albumin  Levels above the normal range, along with  other test results, may indicate dehydration. They may also be caused by a diet that is high in protein. Sometimes, the band placed around the upper arm during the process of drawing blood can cause the level of this protein in your blood to rise and give you a result above the normal range.  Levels below the normal range, along with other tests results, may indicate kidney disease, liver disease, or malabsorption of nutrients. PT and INR  Levels above the normal range mean your blood is clotting slower than normal. This may be due to blood disorders, liver disorders, or low levels of vitamin K. Total protein  Levels above the normal range, along with other test results, may be due to infection or other diseases.  Levels below the normal range, along with other test results, may be due to an immune system disorder, bleeding, burns, kidney disorder, liver disease, trouble absorbing or getting enough  nutrients, or other conditions that affect the intestines. Talk with your health care provider to discuss your results, treatment options, and if necessary, the need for more tests. Talk with your health care provider if you have any questions about your results. This information is not intended to replace advice given to you by your health care provider. Make sure you discuss any questions you have with your health care provider. Document Released: 01/23/2004 Document Revised: 08/26/2015 Document Reviewed: 04/25/2013 Elsevier Interactive Patient Education  [employer]2018 [employer]

## 2017-03-23 NOTE — Telephone Encounter (Signed)
New Message  Pt c/o medication issue:  1. Name of Medication:  Benicar 20 mg tablet once daily  2. How are you currently taking this medication (dosage and times per day)? . See above  3. Are you having a reaction (difficulty breathing--STAT)?  N/A  4. What is your medication issue?  Pharmacist called and verbalized they have been out of the medication for awhile and they do not know when they are going to get this medication and wondering if patient can be put on an alternative rx.  Please f/u

## 2017-03-23 NOTE — Telephone Encounter (Signed)
I sent Timothy Gardner to replace Olmesartan that was sent today.
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
## 2017-03-23 NOTE — Telephone Encounter (Signed)
Please advise 

## 2017-03-27 ENCOUNTER — Other Ambulatory Visit: Payer: Self-pay | Admitting: Medical

## 2017-03-27 ENCOUNTER — Telehealth: Payer: Self-pay | Admitting: Medical

## 2017-03-27 MED ORDER — IRBESARTAN 150 MG PO TABS: 150.0000 mg | ORAL_TABLET | Freq: Every day | ORAL | 2 refills | Status: DC

## 2017-03-27 NOTE — Telephone Encounter (Signed)
I sent Irbesartan as alternate since the other isn't covered.

## 2017-03-27 NOTE — Telephone Encounter (Signed)
Recv'd fax from [facility] Timothy Gardner not covered preferred alternatives are Losartan 50mg , Valsartan 160mg   and Irbesartan 150mg , do you want to switch?

## 2017-03-28 NOTE — Telephone Encounter (Signed)
Called pt and informed

## 2017-04-04 ENCOUNTER — Ambulatory Visit (INDEPENDENT_AMBULATORY_CARE_PROVIDER_SITE_OTHER): Payer: BLUE CROSS/BLUE SHIELD | Admitting: Orthopaedic Surgery

## 2017-06-20 ENCOUNTER — Telehealth: Payer: Self-pay

## 2017-06-20 ENCOUNTER — Other Ambulatory Visit (INDEPENDENT_AMBULATORY_CARE_PROVIDER_SITE_OTHER): Payer: BLUE CROSS/BLUE SHIELD

## 2017-06-20 DIAGNOSIS — D649 Anemia, unspecified: Secondary | ICD-10-CM

## 2017-06-20 DIAGNOSIS — R945 Abnormal results of liver function studies: Secondary | ICD-10-CM

## 2017-06-20 DIAGNOSIS — R7989 Other specified abnormal findings of blood chemistry: Secondary | ICD-10-CM

## 2017-06-20 NOTE — Telephone Encounter (Signed)
Pt was seen for a nurse visit to have labs done and blood pressure checked. Blood pressure reading was 150/70.

## 2017-06-21 ENCOUNTER — Other Ambulatory Visit: Payer: Self-pay | Admitting: Medical

## 2017-06-21 DIAGNOSIS — D649 Anemia, unspecified: Secondary | ICD-10-CM

## 2017-06-21 LAB — HEPATIC FUNCTION PANEL
ALT: 23 IU/L (ref 0–44)
AST: 17 IU/L (ref 0–40)
Albumin: 4.6 g/dL (ref 3.5–5.5)
Alkaline Phosphatase: 86 IU/L (ref 39–117)
Bilirubin Total: 0.3 mg/dL (ref 0.0–1.2)
Bilirubin, Direct: 0.1 mg/dL (ref 0.00–0.40)
Total Protein: 7.1 g/dL (ref 6.0–8.5)

## 2017-06-21 LAB — CBC WITH DIFFERENTIAL/PLATELET
Basophils Absolute: 0 10*3/uL (ref 0.0–0.2)
Basos: 1 %
EOS (ABSOLUTE): 0.1 10*3/uL (ref 0.0–0.4)
Eos: 1 %
Hematocrit: 34.1 % — ABNORMAL LOW (ref 37.5–51.0)
Hemoglobin: 10.7 g/dL — ABNORMAL LOW (ref 13.0–17.7)
Immature Grans (Abs): 0.1 10*3/uL (ref 0.0–0.1)
Immature Granulocytes: 1 %
Lymphocytes Absolute: 1.9 10*3/uL (ref 0.7–3.1)
Lymphs: 26 %
MCH: 21.3 pg — ABNORMAL LOW (ref 26.6–33.0)
MCHC: 31.4 g/dL — ABNORMAL LOW (ref 31.5–35.7)
MCV: 68 fL — ABNORMAL LOW (ref 79–97)
Monocytes Absolute: 0.8 10*3/uL (ref 0.1–0.9)
Monocytes: 11 %
Neutrophils Absolute: 4.2 10*3/uL (ref 1.4–7.0)
Neutrophils: 60 %
Platelets: 347 10*3/uL (ref 150–450)
RBC: 5.02 x10E6/uL (ref 4.14–5.80)
RDW: 16.5 % — ABNORMAL HIGH (ref 12.3–15.4)
WBC: 7 10*3/uL (ref 3.4–10.8)

## 2017-06-21 LAB — APTT: aPTT: 27 s (ref 24–33)

## 2017-06-21 LAB — PROTIME-INR
INR: 1 (ref 0.8–1.2)
Prothrombin Time: 10.4 s (ref 9.1–12.0)

## 2017-06-22 ENCOUNTER — Other Ambulatory Visit: Payer: Self-pay | Admitting: Medical

## 2017-06-22 MED ORDER — FERROUS GLUCONATE 324 (38 FE) MG PO TABS
324.0000 mg | ORAL_TABLET | Freq: Two times a day (BID) | ORAL | 3 refills | Status: DC
Start: 2017-06-22 — End: 2019-12-20

## 2017-06-24 LAB — IRON AND TIBC
Iron Saturation: 6 % — CL (ref 15–55)
Iron: 28 ug/dL — ABNORMAL LOW (ref 38–169)
Total Iron Binding Capacity: 433 ug/dL (ref 250–450)
UIBC: 405 ug/dL — ABNORMAL HIGH (ref 111–343)

## 2017-06-24 LAB — FOLATE: Folate: 9 ng/mL (ref >3.0)

## 2017-06-24 LAB — SPECIMEN STATUS REPORT

## 2017-06-27 ENCOUNTER — Ambulatory Visit (INDEPENDENT_AMBULATORY_CARE_PROVIDER_SITE_OTHER): Payer: BLUE CROSS/BLUE SHIELD | Admitting: Medical

## 2017-06-27 VITALS — BP 136/86 | HR 82 | Temp 97.8°F | Resp 16 | Ht 68.0 in | Wt 185.8 lb

## 2017-06-27 DIAGNOSIS — Z789 Other specified health status: Secondary | ICD-10-CM

## 2017-06-27 DIAGNOSIS — I10 Essential (primary) hypertension: Secondary | ICD-10-CM | POA: Diagnosis not present

## 2017-06-27 DIAGNOSIS — E611 Iron deficiency: Secondary | ICD-10-CM | POA: Diagnosis not present

## 2017-06-27 DIAGNOSIS — Z7289 Other problems related to lifestyle: Secondary | ICD-10-CM

## 2017-06-27 DIAGNOSIS — R945 Abnormal results of liver function studies: Secondary | ICD-10-CM

## 2017-06-27 DIAGNOSIS — D649 Anemia, unspecified: Secondary | ICD-10-CM

## 2017-06-27 DIAGNOSIS — K219 Gastro-esophageal reflux disease without esophagitis: Secondary | ICD-10-CM

## 2017-06-27 DIAGNOSIS — R7989 Other specified abnormal findings of blood chemistry: Secondary | ICD-10-CM
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
## 2017-08-09 DIAGNOSIS — K210 Gastro-esophageal reflux disease with esophagitis: Secondary | ICD-10-CM | POA: Diagnosis not present

## 2017-09-27 ENCOUNTER — Ambulatory Visit: Payer: Self-pay | Admitting: Medical

## 2018-02-22 ENCOUNTER — Encounter: Payer: Self-pay | Admitting: Medical

## 2018-02-28 ENCOUNTER — Encounter: Payer: Self-pay | Admitting: Medical

## 2019-02-24 ENCOUNTER — Other Ambulatory Visit: Payer: Self-pay

## 2019-02-24 ENCOUNTER — Emergency Department (HOSPITAL_COMMUNITY)
Admission: EM | Admit: 2019-02-24 | Discharge: 2019-02-24 | Disposition: A | Discharge status: Home / Self Care | Payer: BC Managed Care – PPO | Attending: Emergency Medicine | Admitting: Emergency Medicine

## 2019-02-24 ENCOUNTER — Encounter (HOSPITAL_COMMUNITY): Payer: Self-pay

## 2019-02-24 DIAGNOSIS — Y9389 Activity, other specified: Secondary | ICD-10-CM | POA: Diagnosis not present

## 2019-02-24 DIAGNOSIS — Z23 Encounter for immunization: Secondary | ICD-10-CM | POA: Insufficient documentation

## 2019-02-24 DIAGNOSIS — I10 Essential (primary) hypertension: Secondary | ICD-10-CM | POA: Diagnosis not present

## 2019-02-24 DIAGNOSIS — W311XXA Contact with metalworking machines, initial encounter: Secondary | ICD-10-CM | POA: Diagnosis not present

## 2019-02-24 DIAGNOSIS — Y929 Unspecified place or not applicable: Secondary | ICD-10-CM | POA: Diagnosis not present

## 2019-02-24 DIAGNOSIS — Z79899 Other long term (current) drug therapy: Secondary | ICD-10-CM | POA: Diagnosis not present

## 2019-02-24 DIAGNOSIS — Y999 Unspecified external cause status: Secondary | ICD-10-CM | POA: Diagnosis not present

## 2019-02-24 DIAGNOSIS — T1501XA Foreign body in cornea, right eye, initial encounter: Secondary | ICD-10-CM | POA: Diagnosis not present

## 2019-02-24 MED ORDER — MOXIFLOXACIN HCL 0.5 % OP SOLN: 1.0000 [drp] | Freq: Four times a day (QID) | OPHTHALMIC | 0 refills | Status: DC

## 2019-02-24 MED ORDER — TETANUS-DIPHTH-ACELL PERTUSSIS 5-2.5-18.5 LF-MCG/0.5 IM SUSP
0.5000 mL | Freq: Once | INTRAMUSCULAR | Status: AC
  Administered 2019-02-24: 0.5 mL via INTRAMUSCULAR
  Filled 2019-02-24: qty 0.5

## 2019-02-24 MED ORDER — FLUORESCEIN SODIUM 1 MG OP STRP
1.0000 | ORAL_STRIP | Freq: Once | OPHTHALMIC | Status: AC
  Administered 2019-02-24: 15:00:00 1 via OPHTHALMIC
  Filled 2019-02-24: qty 1

## 2019-02-24 MED ORDER — TETRACAINE HCL 0.5 % OP SOLN
2.0000 [drp] | Freq: Once | OPHTHALMIC | Status: AC
  Administered 2019-02-24: 2 [drp] via OPHTHALMIC
  Filled 2019-02-24: qty 4

## 2019-02-24 MED ORDER — GATIFLOXACIN 0.5 % OP SOLN
1.0000 [drp] | Freq: Four times a day (QID) | OPHTHALMIC | Status: DC
  Filled 2019-02-24: qty 2.5
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
## 2019-02-24 NOTE — ED Triage Notes (Signed)
Patient complains of right eye redness x 1 days. States that he thinks he may have gotten a piece of metal in same after working on jeep yesterday. Sensitive to light and redness noted

## 2019-02-24 NOTE — Discharge Instructions (Addendum)
You have been diagnosed today with foreign body of right cornea.  At this time there does not appear to be the presence of an emergent medical condition, however there is always the potential for conditions to change. Please read and follow the below instructions.  Please return to the Emergency Department immediately for any new or worsening symptoms. Please be sure to follow up with your Primary Care Provider within one week regarding your visit today; please call their office to schedule an appointment even if you are feeling better for a follow-up visit. Please fill your antibiotic prescription for the antibiotic eyedrop moxifloxacin and use 4 times daily to avoid infection. Dr. Charlotte Sanes expects to see you at the eye doctor's office at 5 PM today for assessment and removal of your foreign body.  Make sure you are there on time for your appointment.  Get help right away if: Your ability to see (vision) gets worse. You have more redness and swelling in or around your eye. You have any new/concerning or worsening of symptoms  Please read the additional information packets attached to your discharge summary.  Do not take your medicine if  develop an itchy rash, swelling in your mouth or lips, or difficulty breathing; call 911 and seek immediate emergency medical attention if this occurs.  Note: Portions of this text may have been transcribed using voice recognition software. Every effort was made to ensure accuracy; however, inadvertent computerized transcription errors may still be present.

## 2019-02-26 DIAGNOSIS — T1501XD Foreign body in cornea, right eye, subsequent encounter: Secondary | ICD-10-CM | POA: Diagnosis not present

## 2019-03-01 DIAGNOSIS — T1501XD Foreign body in cornea, right eye, subsequent encounter: Secondary | ICD-10-CM | POA: Diagnosis not present

## 2019-03-06 DIAGNOSIS — H1789 Other corneal scars and opacities: Secondary | ICD-10-CM | POA: Diagnosis not present

## 2019-03-06 DIAGNOSIS — T1501XD Foreign body in cornea, right eye, subsequent encounter: Secondary | ICD-10-CM | POA: Diagnosis not present

## 2019-09-02 ENCOUNTER — Encounter: Payer: Self-pay | Admitting: Family Medicine

## 2019-09-02 ENCOUNTER — Ambulatory Visit: Payer: BC Managed Care – PPO | Admitting: Family Medicine

## 2019-09-02 ENCOUNTER — Other Ambulatory Visit: Payer: Self-pay

## 2019-09-02 VITALS — Temp 98.5°F

## 2019-09-02 DIAGNOSIS — M25571 Pain in right ankle and joints of right foot: Secondary | ICD-10-CM | POA: Diagnosis not present

## 2019-09-02 MED ORDER — SULFAMETHOXAZOLE-TRIMETHOPRIM 200-40 MG/5ML PO SUSP: 20.0000 mL | Freq: Two times a day (BID) | ORAL | 0 refills | Status: DC
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
## 2019-12-14 ENCOUNTER — Inpatient Hospital Stay (HOSPITAL_COMMUNITY): Payer: BC Managed Care – PPO | Admitting: Certified Registered Nurse Anesthetist

## 2019-12-14 ENCOUNTER — Emergency Department (HOSPITAL_COMMUNITY): Payer: BC Managed Care – PPO

## 2019-12-14 ENCOUNTER — Encounter (HOSPITAL_COMMUNITY): Payer: Self-pay | Admitting: Emergency Medicine

## 2019-12-14 ENCOUNTER — Inpatient Hospital Stay (HOSPITAL_COMMUNITY): Payer: BC Managed Care – PPO

## 2019-12-14 ENCOUNTER — Encounter (HOSPITAL_COMMUNITY): Admission: EM | Disposition: A | Discharge status: Home / Self Care | Payer: Self-pay | Source: Home / Self Care

## 2019-12-14 ENCOUNTER — Inpatient Hospital Stay (HOSPITAL_COMMUNITY)
Admission: EM | Admit: 2019-12-14 | Discharge: 2019-12-18 | DRG: 908 | Disposition: A | Discharge status: Home / Self Care | Payer: BC Managed Care – PPO | Attending: Family Medicine | Admitting: Family Medicine

## 2019-12-14 DIAGNOSIS — J939 Pneumothorax, unspecified: Secondary | ICD-10-CM

## 2019-12-14 DIAGNOSIS — D62 Acute posthemorrhagic anemia: Secondary | ICD-10-CM | POA: Diagnosis not present

## 2019-12-14 DIAGNOSIS — R58 Hemorrhage, not elsewhere classified: Secondary | ICD-10-CM | POA: Diagnosis not present

## 2019-12-14 DIAGNOSIS — S0101XA Laceration without foreign body of scalp, initial encounter: Secondary | ICD-10-CM | POA: Diagnosis present

## 2019-12-14 DIAGNOSIS — J320 Chronic maxillary sinusitis: Secondary | ICD-10-CM | POA: Diagnosis not present

## 2019-12-14 DIAGNOSIS — Y92410 Unspecified street and highway as the place of occurrence of the external cause: Secondary | ICD-10-CM | POA: Diagnosis not present

## 2019-12-14 DIAGNOSIS — S199XXA Unspecified injury of neck, initial encounter: Secondary | ICD-10-CM | POA: Diagnosis not present

## 2019-12-14 DIAGNOSIS — I10 Essential (primary) hypertension: Secondary | ICD-10-CM | POA: Diagnosis not present

## 2019-12-14 DIAGNOSIS — J90 Pleural effusion, not elsewhere classified: Secondary | ICD-10-CM | POA: Diagnosis not present

## 2019-12-14 DIAGNOSIS — R404 Transient alteration of awareness: Secondary | ICD-10-CM | POA: Diagnosis not present

## 2019-12-14 DIAGNOSIS — S2231XA Fracture of one rib, right side, initial encounter for closed fracture: Secondary | ICD-10-CM | POA: Diagnosis not present

## 2019-12-14 DIAGNOSIS — S270XXA Traumatic pneumothorax, initial encounter: Principal | ICD-10-CM | POA: Diagnosis present

## 2019-12-14 DIAGNOSIS — S2243XA Multiple fractures of ribs, bilateral, initial encounter for closed fracture: Secondary | ICD-10-CM | POA: Diagnosis present

## 2019-12-14 DIAGNOSIS — J329 Chronic sinusitis, unspecified: Secondary | ICD-10-CM | POA: Diagnosis not present

## 2019-12-14 DIAGNOSIS — S12601A Unspecified nondisplaced fracture of seventh cervical vertebra, initial encounter for closed fracture: Secondary | ICD-10-CM | POA: Diagnosis present

## 2019-12-14 DIAGNOSIS — S22009A Unspecified fracture of unspecified thoracic vertebra, initial encounter for closed fracture: Secondary | ICD-10-CM

## 2019-12-14 DIAGNOSIS — I517 Cardiomegaly: Secondary | ICD-10-CM | POA: Diagnosis not present

## 2019-12-14 DIAGNOSIS — T07XXXA Unspecified multiple injuries, initial encounter: Secondary | ICD-10-CM

## 2019-12-14 DIAGNOSIS — Z20822 Contact with and (suspected) exposure to covid-19: Secondary | ICD-10-CM | POA: Diagnosis present

## 2019-12-14 DIAGNOSIS — S22028A Other fracture of second thoracic vertebra, initial encounter for closed fracture: Secondary | ICD-10-CM | POA: Diagnosis not present

## 2019-12-14 DIAGNOSIS — S12600A Unspecified displaced fracture of seventh cervical vertebra, initial encounter for closed fracture: Secondary | ICD-10-CM | POA: Diagnosis not present

## 2019-12-14 DIAGNOSIS — Z87828 Personal history of other (healed) physical injury and trauma: Secondary | ICD-10-CM | POA: Diagnosis not present

## 2019-12-14 DIAGNOSIS — S0993XA Unspecified injury of face, initial encounter: Secondary | ICD-10-CM | POA: Diagnosis not present

## 2019-12-14 DIAGNOSIS — G4489 Other headache syndrome: Secondary | ICD-10-CM | POA: Diagnosis not present

## 2019-12-14 DIAGNOSIS — S3993XA Unspecified injury of pelvis, initial encounter: Secondary | ICD-10-CM | POA: Diagnosis not present

## 2019-12-14 DIAGNOSIS — S27322A Contusion of lung, bilateral, initial encounter: Secondary | ICD-10-CM

## 2019-12-14 DIAGNOSIS — M545 Low back pain, unspecified: Secondary | ICD-10-CM | POA: Diagnosis present

## 2019-12-14 DIAGNOSIS — T8182XA Emphysema (subcutaneous) resulting from a procedure, initial encounter: Secondary | ICD-10-CM | POA: Diagnosis not present

## 2019-12-14 DIAGNOSIS — S12690A Other displaced fracture of seventh cervical vertebra, initial encounter for closed fracture: Secondary | ICD-10-CM | POA: Diagnosis not present

## 2019-12-14 DIAGNOSIS — S3991XA Unspecified injury of abdomen, initial encounter: Secondary | ICD-10-CM | POA: Diagnosis not present

## 2019-12-14 DIAGNOSIS — I712 Thoracic aortic aneurysm, without rupture: Secondary | ICD-10-CM | POA: Diagnosis present

## 2019-12-14 DIAGNOSIS — F10129 Alcohol abuse with intoxication, unspecified: Secondary | ICD-10-CM | POA: Diagnosis present

## 2019-12-14 DIAGNOSIS — S2509XA Other specified injury of thoracic aorta, initial encounter: Principal | ICD-10-CM | POA: Diagnosis present

## 2019-12-14 DIAGNOSIS — S2241XA Multiple fractures of ribs, right side, initial encounter for closed fracture: Secondary | ICD-10-CM

## 2019-12-14 DIAGNOSIS — J9811 Atelectasis: Secondary | ICD-10-CM | POA: Diagnosis not present

## 2019-12-14 DIAGNOSIS — S22069A Unspecified fracture of T7-T8 vertebra, initial encounter for closed fracture: Secondary | ICD-10-CM | POA: Diagnosis present

## 2019-12-14 DIAGNOSIS — S2232XA Fracture of one rib, left side, initial encounter for closed fracture: Secondary | ICD-10-CM | POA: Diagnosis not present

## 2019-12-14 DIAGNOSIS — S22079A Unspecified fracture of T9-T10 vertebra, initial encounter for closed fracture: Secondary | ICD-10-CM | POA: Diagnosis not present

## 2019-12-14 DIAGNOSIS — S299XXA Unspecified injury of thorax, initial encounter: Secondary | ICD-10-CM | POA: Diagnosis not present

## 2019-12-14 DIAGNOSIS — Y909 Presence of alcohol in blood, level not specified: Secondary | ICD-10-CM | POA: Diagnosis present

## 2019-12-14 DIAGNOSIS — J984 Other disorders of lung: Secondary | ICD-10-CM | POA: Diagnosis not present

## 2019-12-14 DIAGNOSIS — R52 Pain, unspecified: Secondary | ICD-10-CM | POA: Diagnosis not present

## 2019-12-14 DIAGNOSIS — T1490XA Injury, unspecified, initial encounter: Secondary | ICD-10-CM

## 2019-12-14 DIAGNOSIS — S0181XA Laceration without foreign body of other part of head, initial encounter: Secondary | ICD-10-CM

## 2019-12-14 HISTORY — PX: ENDOVASCULAR STENT INSERTION: SHX5161

## 2019-12-14 LAB — COMPREHENSIVE METABOLIC PANEL
ALT: 62 U/L — ABNORMAL HIGH (ref 0–44)
AST: 76 U/L — ABNORMAL HIGH (ref 15–41)
Albumin: 3.8 g/dL (ref 3.5–5.0)
Alkaline Phosphatase: 67 U/L (ref 38–126)
Anion gap: 13 (ref 5–15)
BUN: 12 mg/dL (ref 6–20)
CO2: 22 mmol/L (ref 22–32)
Calcium: 9.1 mg/dL (ref 8.9–10.3)
Chloride: 106 mmol/L (ref 98–111)
Creatinine, Ser: 1.2 mg/dL (ref 0.61–1.24)
GFR, Estimated: >60 mL/min (ref >60)
Glucose, Bld: 152 mg/dL — ABNORMAL HIGH (ref 70–99)
Potassium: 3.1 mmol/L — ABNORMAL LOW (ref 3.5–5.1)
Sodium: 141 mmol/L (ref 135–145)
Total Bilirubin: 0.4 mg/dL (ref 0.3–1.2)
Total Protein: 6.7 g/dL (ref 6.5–8.1)

## 2019-12-14 LAB — URINALYSIS, ROUTINE W REFLEX MICROSCOPIC
Bacteria, UA: NONE SEEN
Bilirubin Urine: NEGATIVE
Glucose, UA: NEGATIVE mg/dL
Ketones, ur: NEGATIVE mg/dL
Leukocytes,Ua: NEGATIVE
Nitrite: NEGATIVE
Protein, ur: NEGATIVE mg/dL
Specific Gravity, Urine: 1.019 (ref 1.005–1.030)
pH: 6 (ref 5.0–8.0)

## 2019-12-14 LAB — I-STAT CHEM 8, ED
BUN: 12 mg/dL (ref 6–20)
Calcium, Ion: 1.19 mmol/L (ref 1.15–1.40)
Chloride: 106 mmol/L (ref 98–111)
Creatinine, Ser: 1.4 mg/dL — ABNORMAL HIGH (ref 0.61–1.24)
Glucose, Bld: 141 mg/dL — ABNORMAL HIGH (ref 70–99)
HCT: 34 % — ABNORMAL LOW (ref 39.0–52.0)
Hemoglobin: 11.6 g/dL — ABNORMAL LOW (ref 13.0–17.0)
Potassium: 3 mmol/L — ABNORMAL LOW (ref 3.5–5.1)
Sodium: 143 mmol/L (ref 135–145)
TCO2: 22 mmol/L (ref 22–32)

## 2019-12-14 LAB — ETHANOL: Alcohol, Ethyl (B): 155 mg/dL — ABNORMAL HIGH (ref <10)

## 2019-12-14 LAB — MAGNESIUM: Magnesium: 1.8 mg/dL (ref 1.7–2.4)

## 2019-12-14 LAB — RESP PANEL BY RT-PCR (FLU A&B, COVID) ARPGX2
Influenza A by PCR: NEGATIVE
Influenza B by PCR: NEGATIVE
SARS Coronavirus 2 by RT PCR: NEGATIVE

## 2019-12-14 LAB — CBC
HCT: 36.4 % — ABNORMAL LOW (ref 39.0–52.0)
Hemoglobin: 9.8 g/dL — ABNORMAL LOW (ref 13.0–17.0)
MCH: 17.7 pg — ABNORMAL LOW (ref 26.0–34.0)
MCHC: 26.9 g/dL — ABNORMAL LOW (ref 30.0–36.0)
MCV: 65.7 fL — ABNORMAL LOW (ref 80.0–100.0)
Platelets: 469 10*3/uL — ABNORMAL HIGH (ref 150–400)
RBC: 5.54 MIL/uL (ref 4.22–5.81)
RDW: 19.5 % — ABNORMAL HIGH (ref 11.5–15.5)
WBC: 20.4 10*3/uL — ABNORMAL HIGH (ref 4.0–10.5)
nRBC: 0.1 % (ref 0.0–0.2)

## 2019-12-14 LAB — PROTIME-INR
INR: 1 (ref 0.8–1.2)
Prothrombin Time: 13 seconds (ref 11.4–15.2)

## 2019-12-14 LAB — PREPARE RBC (CROSSMATCH)

## 2019-12-14 LAB — SAMPLE TO BLOOD BANK

## 2019-12-14 LAB — MRSA PCR SCREENING: MRSA by PCR: NEGATIVE

## 2019-12-14 LAB — HIV ANTIBODY (ROUTINE TESTING W REFLEX): HIV Screen 4th Generation wRfx: NONREACTIVE

## 2019-12-14 LAB — ABO/RH: ABO/RH(D): O POS

## 2019-12-14 LAB — PHOSPHORUS: Phosphorus: 3.5 mg/dL (ref 2.5–4.6)

## 2019-12-14 LAB — LACTIC ACID, PLASMA: Lactic Acid, Venous: 3.8 mmol/L (ref 0.5–1.9)

## 2019-12-14 SURGERY — ENDOVASCULAR STENT GRAFT INSERTION
Anesthesia: General | Site: Groin

## 2019-12-14 MED ORDER — MIDAZOLAM HCL 2 MG/2ML IJ SOLN
INTRAMUSCULAR | Status: AC
  Filled 2019-12-14: qty 2

## 2019-12-14 MED ORDER — THIAMINE HCL 100 MG/ML IJ SOLN
100.0000 mg | Freq: Every day | INTRAMUSCULAR | Status: DC
  Administered 2019-12-15 – 2019-12-18 (×2): 100 mg via INTRAVENOUS
  Filled 2019-12-14 (×2): qty 2

## 2019-12-14 MED ORDER — LORAZEPAM 1 MG PO TABS: 1.0000 mg | ORAL_TABLET | ORAL | Status: AC | PRN

## 2019-12-14 MED ORDER — ONDANSETRON HCL 4 MG/2ML IJ SOLN
4.0000 mg | Freq: Once | INTRAMUSCULAR | Status: AC
  Administered 2019-12-14: 4 mg via INTRAVENOUS

## 2019-12-14 MED ORDER — METOPROLOL TARTRATE 5 MG/5ML IV SOLN
5.0000 mg | Freq: Four times a day (QID) | INTRAVENOUS | Status: DC | PRN
Start: 2019-12-14 — End: 2019-12-15
  Administered 2019-12-14: 5 mg via INTRAVENOUS

## 2019-12-14 MED ORDER — FENTANYL CITRATE (PF) 250 MCG/5ML IJ SOLN
INTRAMUSCULAR | Status: AC
  Filled 2019-12-14: qty 5

## 2019-12-14 MED ORDER — ENOXAPARIN SODIUM 30 MG/0.3ML ~~LOC~~ SOLN
30.0000 mg | Freq: Two times a day (BID) | SUBCUTANEOUS | Status: DC
  Administered 2019-12-16 – 2019-12-18 (×6): 30 mg via SUBCUTANEOUS
  Filled 2019-12-14 (×6): qty 0.3

## 2019-12-14 MED ORDER — MORPHINE SULFATE (PF) 2 MG/ML IV SOLN
2.0000 mg | INTRAVENOUS | Status: DC | PRN
  Administered 2019-12-14: 2 mg via INTRAMUSCULAR
  Filled 2019-12-14: qty 1

## 2019-12-14 MED ORDER — LABETALOL HCL 5 MG/ML IV SOLN
INTRAVENOUS | Status: DC | PRN
  Administered 2019-12-14: 10 mg via INTRAVENOUS
  Administered 2019-12-14 (×2): 5 mg via INTRAVENOUS

## 2019-12-14 MED ORDER — ROCURONIUM BROMIDE 10 MG/ML (PF) SYRINGE
PREFILLED_SYRINGE | INTRAVENOUS | Status: DC | PRN
  Administered 2019-12-14: 50 mg via INTRAVENOUS
  Administered 2019-12-14: 20 mg via INTRAVENOUS

## 2019-12-14 MED ORDER — LIDOCAINE-EPINEPHRINE 1 %-1:100000 IJ SOLN
20.0000 mL | Freq: Once | INTRAMUSCULAR | Status: AC
  Administered 2019-12-14: 20 mL via INTRADERMAL
  Filled 2019-12-14: qty 1

## 2019-12-14 MED ORDER — SUGAMMADEX SODIUM 200 MG/2ML IV SOLN
INTRAVENOUS | Status: DC | PRN
  Administered 2019-12-14: 200 mg via INTRAVENOUS

## 2019-12-14 MED ORDER — ONDANSETRON HCL 4 MG/2ML IJ SOLN
INTRAMUSCULAR | Status: AC
  Filled 2019-12-14: qty 2

## 2019-12-14 MED ORDER — PROTAMINE SULFATE 10 MG/ML IV SOLN
INTRAVENOUS | Status: AC
  Filled 2019-12-14: qty 5

## 2019-12-14 MED ORDER — LORAZEPAM 2 MG/ML IJ SOLN
1.0000 mg | INTRAMUSCULAR | Status: AC | PRN
  Administered 2019-12-16: 2 mg via INTRAVENOUS
  Filled 2019-12-14: qty 1

## 2019-12-14 MED ORDER — PROPOFOL 10 MG/ML IV BOLUS
INTRAVENOUS | Status: AC
  Filled 2019-12-14: qty 20

## 2019-12-14 MED ORDER — PANTOPRAZOLE SODIUM 40 MG PO TBEC
40.0000 mg | DELAYED_RELEASE_TABLET | Freq: Every day | ORAL | Status: DC
  Administered 2019-12-14: 40 mg via ORAL
  Filled 2019-12-14: qty 1

## 2019-12-14 MED ORDER — DOCUSATE SODIUM 100 MG PO CAPS
100.0000 mg | ORAL_CAPSULE | Freq: Two times a day (BID) | ORAL | Status: DC
  Administered 2019-12-15: 100 mg via ORAL
  Filled 2019-12-14 (×3): qty 1

## 2019-12-14 MED ORDER — FENTANYL CITRATE (PF) 250 MCG/5ML IJ SOLN
INTRAMUSCULAR | Status: DC | PRN
  Administered 2019-12-14: 100 ug via INTRAVENOUS
  Administered 2019-12-14 (×3): 50 ug via INTRAVENOUS

## 2019-12-14 MED ORDER — LIDOCAINE 2% (20 MG/ML) 5 ML SYRINGE
INTRAMUSCULAR | Status: DC | PRN
  Administered 2019-12-14: 20 mg via INTRAVENOUS

## 2019-12-14 MED ORDER — LABETALOL HCL 5 MG/ML IV SOLN
INTRAVENOUS | Status: AC
  Filled 2019-12-14: qty 4

## 2019-12-14 MED ORDER — ONDANSETRON HCL 4 MG/2ML IJ SOLN
4.0000 mg | Freq: Four times a day (QID) | INTRAMUSCULAR | Status: DC | PRN
  Administered 2019-12-14: 4 mg via INTRAVENOUS

## 2019-12-14 MED ORDER — LACTATED RINGERS IV SOLN: INTRAVENOUS | Status: DC | PRN

## 2019-12-14 MED ORDER — IOHEXOL 300 MG/ML  SOLN
100.0000 mL | Freq: Once | INTRAMUSCULAR | Status: AC | PRN
  Administered 2019-12-14: 100 mL via INTRAVENOUS

## 2019-12-14 MED ORDER — MIDAZOLAM HCL 5 MG/5ML IJ SOLN
INTRAMUSCULAR | Status: DC | PRN
  Administered 2019-12-14: 2 mg via INTRAVENOUS

## 2019-12-14 MED ORDER — PANTOPRAZOLE SODIUM 40 MG IV SOLR
40.0000 mg | Freq: Every day | INTRAVENOUS | Status: DC
  Administered 2019-12-15 – 2019-12-18 (×4): 40 mg via INTRAVENOUS
  Filled 2019-12-14 (×4): qty 40

## 2019-12-14 MED ORDER — PROPOFOL 10 MG/ML IV BOLUS
INTRAVENOUS | Status: DC | PRN
  Administered 2019-12-14: 150 mg via INTRAVENOUS

## 2019-12-14 MED ORDER — SODIUM CHLORIDE 0.9 % IV SOLN
INTRAVENOUS | Status: DC | PRN
  Administered 2019-12-14: 500 mL

## 2019-12-14 MED ORDER — SODIUM CHLORIDE (PF) 0.9 % IJ SOLN
INTRAMUSCULAR | Status: AC
  Filled 2019-12-14: qty 10

## 2019-12-14 MED ORDER — ONDANSETRON 4 MG PO TBDP: 4.0000 mg | ORAL_TABLET | Freq: Four times a day (QID) | ORAL | Status: DC | PRN

## 2019-12-14 MED ORDER — OXYCODONE HCL 5 MG PO TABS
5.0000 mg | ORAL_TABLET | ORAL | Status: DC | PRN
  Administered 2019-12-14 – 2019-12-15 (×5): 10 mg via ORAL
  Administered 2019-12-16: 5 mg via ORAL
  Administered 2019-12-16: 10 mg via ORAL
  Administered 2019-12-16: 5 mg via ORAL
  Administered 2019-12-17: 10 mg via ORAL
  Administered 2019-12-17 – 2019-12-18 (×3): 5 mg via ORAL
  Administered 2019-12-18: 10 mg via ORAL
  Filled 2019-12-14 (×2): qty 2
  Filled 2019-12-14 (×2): qty 1
  Filled 2019-12-14: qty 2
  Filled 2019-12-14 (×3): qty 1
  Filled 2019-12-14: qty 2
  Filled 2019-12-14: qty 1
  Filled 2019-12-14 (×3): qty 2
  Filled 2019-12-14: qty 1
  Filled 2019-12-14: qty 2

## 2019-12-14 MED ORDER — ETOMIDATE 2 MG/ML IV SOLN
INTRAVENOUS | Status: AC
  Filled 2019-12-14: qty 10

## 2019-12-14 MED ORDER — ESMOLOL HCL 100 MG/10ML IV SOLN
INTRAVENOUS | Status: DC | PRN
  Administered 2019-12-14: 30 mg via INTRAVENOUS
  Administered 2019-12-14: 20 mg via INTRAVENOUS

## 2019-12-14 MED ORDER — PHENYLEPHRINE HCL-NACL 10-0.9 MG/250ML-% IV SOLN
INTRAVENOUS | Status: DC | PRN
  Administered 2019-12-14: 30 ug/min via INTRAVENOUS

## 2019-12-14 MED ORDER — SUCCINYLCHOLINE CHLORIDE 200 MG/10ML IV SOSY
PREFILLED_SYRINGE | INTRAVENOUS | Status: DC | PRN
  Administered 2019-12-14: 120 mg via INTRAVENOUS

## 2019-12-14 MED ORDER — FENTANYL CITRATE (PF) 100 MCG/2ML IJ SOLN
50.0000 ug | Freq: Once | INTRAMUSCULAR | Status: AC
  Administered 2019-12-14: 50 ug via INTRAVENOUS

## 2019-12-14 MED ORDER — CEFAZOLIN SODIUM 1 G IJ SOLR
INTRAMUSCULAR | Status: AC
  Filled 2019-12-14: qty 20

## 2019-12-14 MED ORDER — ONDANSETRON HCL 4 MG/2ML IJ SOLN
INTRAMUSCULAR | Status: DC | PRN
  Administered 2019-12-14: 4 mg via INTRAVENOUS

## 2019-12-14 MED ORDER — FENTANYL CITRATE (PF) 100 MCG/2ML IJ SOLN
INTRAMUSCULAR | Status: AC
  Filled 2019-12-14: qty 2

## 2019-12-14 MED ORDER — PHENYLEPHRINE 40 MCG/ML (10ML) SYRINGE FOR IV PUSH (FOR BLOOD PRESSURE SUPPORT)
PREFILLED_SYRINGE | INTRAVENOUS | Status: DC | PRN
  Administered 2019-12-14: 80 ug via INTRAVENOUS

## 2019-12-14 MED ORDER — METOPROLOL TARTRATE 5 MG/5ML IV SOLN
INTRAVENOUS | Status: AC
  Filled 2019-12-14: qty 5

## 2019-12-14 MED ORDER — FOLIC ACID 1 MG PO TABS
1.0000 mg | ORAL_TABLET | Freq: Every day | ORAL | Status: DC
  Administered 2019-12-14 – 2019-12-18 (×5): 1 mg via ORAL
  Filled 2019-12-14 (×6): qty 1

## 2019-12-14 MED ORDER — SODIUM CHLORIDE 0.9 % IV SOLN: INTRAVENOUS | Status: DC

## 2019-12-14 MED ORDER — PROTAMINE SULFATE 10 MG/ML IV SOLN
INTRAVENOUS | Status: DC | PRN
  Administered 2019-12-14: 40 mg via INTRAVENOUS

## 2019-12-14 MED ORDER — THIAMINE HCL 100 MG PO TABS
100.0000 mg | ORAL_TABLET | Freq: Every day | ORAL | Status: DC
  Administered 2019-12-14 – 2019-12-17 (×3): 100 mg via ORAL
  Filled 2019-12-14 (×3): qty 1

## 2019-12-14 MED ORDER — DEXAMETHASONE SODIUM PHOSPHATE 10 MG/ML IJ SOLN
INTRAMUSCULAR | Status: DC | PRN
  Administered 2019-12-14: 4 mg via INTRAVENOUS

## 2019-12-14 MED ORDER — SODIUM CHLORIDE 0.9 % IV BOLUS (SEPSIS)
1000.0000 mL | Freq: Once | INTRAVENOUS | Status: AC
  Administered 2019-12-14: 1000 mL via INTRAVENOUS

## 2019-12-14 MED ORDER — CEFAZOLIN SODIUM-DEXTROSE 2-3 GM-%(50ML) IV SOLR
INTRAVENOUS | Status: DC | PRN
  Administered 2019-12-14: 2 g via INTRAVENOUS

## 2019-12-14 MED ORDER — ALBUMIN HUMAN 5 % IV SOLN: INTRAVENOUS | Status: DC | PRN

## 2019-12-14 MED ORDER — CHLORHEXIDINE GLUCONATE CLOTH 2 % EX PADS
6.0000 | MEDICATED_PAD | Freq: Every day | CUTANEOUS | Status: DC
  Administered 2019-12-14 – 2019-12-18 (×5): 6 via TOPICAL

## 2019-12-14 MED ORDER — ADULT MULTIVITAMIN W/MINERALS CH
1.0000 | ORAL_TABLET | Freq: Every day | ORAL | Status: DC
  Administered 2019-12-14 – 2019-12-18 (×5): 1 via ORAL
  Filled 2019-12-14 (×5): qty 1

## 2019-12-14 MED ORDER — ACETAMINOPHEN 325 MG PO TABS: 650.0000 mg | ORAL_TABLET | ORAL | Status: DC | PRN

## 2019-12-14 MED ORDER — IODIXANOL 320 MG/ML IV SOLN
INTRAVENOUS | Status: DC | PRN
  Administered 2019-12-14: 70.2 mL

## 2019-12-14 MED ORDER — HEPARIN SODIUM (PORCINE) 1000 UNIT/ML IJ SOLN
INTRAMUSCULAR | Status: DC | PRN
  Administered 2019-12-14: 5000 [IU] via INTRAVENOUS
  Administered 2019-12-14: 3000 [IU] via INTRAVENOUS

## 2019-12-14 SURGICAL SUPPLY — 73 items
BAG DECANTER FOR FLEXI CONT (MISCELLANEOUS) IMPLANT
BALLN CODA OCL 2-9.0-35-120-3 (BALLOONS)
BALLOON COD OCL 2-9.0-35-120-3 (BALLOONS) IMPLANT
BLADE CLIPPER SURG (BLADE) ×2 IMPLANT
CANISTER SUCT 3000ML PPV (MISCELLANEOUS) ×2 IMPLANT
CATH ACCU-VU SIZ PIG 5F 100CM (CATHETERS) ×2 IMPLANT
CATH BEACON 5.038 65CM KMP-01 (CATHETERS) ×2 IMPLANT
CATH VISIONS PV .035 IVUS (CATHETERS) ×2 IMPLANT
CLIP VESOCCLUDE MED 24/CT (CLIP) IMPLANT
CLIP VESOCCLUDE SM WIDE 24/CT (CLIP) IMPLANT
CLOSURE PERCLOSE PROSTYLE (VASCULAR PRODUCTS) ×6 IMPLANT
COVER BACK TABLE 80X110 HD (DRAPES) ×2 IMPLANT
COVER MAYO STAND STRL (DRAPES) ×2 IMPLANT
COVER SURGICAL LIGHT HANDLE (MISCELLANEOUS) ×4 IMPLANT
DERMABOND ADVANCED (GAUZE/BANDAGES/DRESSINGS) ×2
DERMABOND ADVANCED .7 DNX12 (GAUZE/BANDAGES/DRESSINGS) ×2 IMPLANT
DEVICE TORQUE 50000 (MISCELLANEOUS) IMPLANT
DRAIN CHANNEL 10F 3/8 F FF (DRAIN) IMPLANT
DRAIN CHANNEL 10M FLAT 3/4 FLT (DRAIN) IMPLANT
DRAPE C-ARM 42X72 X-RAY (DRAPES) ×2 IMPLANT
DRSG TEGADERM 2-3/8X2-3/4 SM (GAUZE/BANDAGES/DRESSINGS) ×4 IMPLANT
DRSG TEGADERM 4X4.75 (GAUZE/BANDAGES/DRESSINGS) ×2 IMPLANT
DRYSEAL FLEXSHEATH 20FR 33CM (SHEATH) ×1
ELECT CAUTERY BLADE 6.4 (BLADE) ×2 IMPLANT
ELECT REM PT RETURN 9FT ADLT (ELECTROSURGICAL) ×4
ELECTRODE REM PT RTRN 9FT ADLT (ELECTROSURGICAL) ×2 IMPLANT
EVACUATOR 3/16  PVC DRAIN (DRAIN)
EVACUATOR 3/16 PVC DRAIN (DRAIN) IMPLANT
EVACUATOR SILICONE 100CC (DRAIN) IMPLANT
GAUZE SPONGE 2X2 8PLY STRL LF (GAUZE/BANDAGES/DRESSINGS) ×2 IMPLANT
GLOVE TRIUMPH SURG SIZE 7.0 (KITS) ×2 IMPLANT
GOWN STRL REUS W/ TWL LRG LVL3 (GOWN DISPOSABLE) ×4 IMPLANT
GOWN STRL REUS W/TWL 2XL LVL3 (GOWN DISPOSABLE) ×2 IMPLANT
GOWN STRL REUS W/TWL LRG LVL3 (GOWN DISPOSABLE) ×8
GRAFT BALLN CATH 65CM (STENTS) IMPLANT
HEMOSTAT SNOW SURGICEL 2X4 (HEMOSTASIS) IMPLANT
HEMOSTAT SURGICEL 2X14 (HEMOSTASIS) IMPLANT
KIT BASIN OR (CUSTOM PROCEDURE TRAY) ×2 IMPLANT
KIT TURNOVER KIT B (KITS) ×2 IMPLANT
NEEDLE PERC 18GX7CM (NEEDLE) ×2 IMPLANT
NS IRRIG 1000ML POUR BTL (IV SOLUTION) ×2 IMPLANT
PACK AORTA (CUSTOM PROCEDURE TRAY) ×2 IMPLANT
PAD ARMBOARD 7.5X6 YLW CONV (MISCELLANEOUS) ×4 IMPLANT
PENCIL BUTTON HOLSTER BLD 10FT (ELECTRODE) IMPLANT
SET MICROPUNCTURE 5F STIFF (MISCELLANEOUS) ×2 IMPLANT
SHEATH DRYSEAL FLEX 20FR 33CM (SHEATH) ×1 IMPLANT
SHEATH PINNACLE 5F 10CM (SHEATH) ×2 IMPLANT
SHEATH PINNACLE 6F 10CM (SHEATH) ×4 IMPLANT
SHEATH PINNACLE 8F 10CM (SHEATH) ×2 IMPLANT
SLEEVE ISOL F/PACE RF HD COVER (MISCELLANEOUS) ×2 IMPLANT
SPONGE GAUZE 2X2 STER 10/PKG (GAUZE/BANDAGES/DRESSINGS) ×2
STAPLER VISISTAT 35W (STAPLE) IMPLANT
STENT GRAFT BALLN CATH 65CM (STENTS)
STENT GRFT THORAC ACS 26X26X10 (Endovascular Graft) ×2 IMPLANT
STOPCOCK MORSE 400PSI 3WAY (MISCELLANEOUS) ×2 IMPLANT
SUT ETHILON 3 0 PS 1 (SUTURE) IMPLANT
SUT PROLENE 5 0 C 1 24 (SUTURE) IMPLANT
SUT VIC AB 2-0 CT1 36 (SUTURE) IMPLANT
SUT VIC AB 3-0 SH 27 (SUTURE)
SUT VIC AB 3-0 SH 27X BRD (SUTURE) IMPLANT
SUT VICRYL 4-0 PS2 18IN ABS (SUTURE) ×4 IMPLANT
SYR 10ML LL (SYRINGE) ×6 IMPLANT
SYR 20ML LL LF (SYRINGE) ×4 IMPLANT
SYR 30ML LL (SYRINGE) IMPLANT
SYR 5ML LL (SYRINGE) IMPLANT
SYR MEDRAD MARK V 150ML (SYRINGE) ×2 IMPLANT
TOWEL GREEN STERILE (TOWEL DISPOSABLE) ×2 IMPLANT
TOWEL GREEN STERILE FF (TOWEL DISPOSABLE) ×2 IMPLANT
TRAY FOLEY MTR SLVR 14FR STAT (SET/KITS/TRAYS/PACK) ×2 IMPLANT
TUBING HIGH PRESSURE 120CM (CONNECTOR) ×2 IMPLANT
WATER STERILE IRR 1000ML POUR (IV SOLUTION) ×2 IMPLANT
WIRE BENTSON .035X145CM (WIRE) ×4 IMPLANT
WIRE STIFF LUNDERQUIST 260CM (WIRE) ×2 IMPLANT
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
## 2019-12-14 NOTE — Progress Notes (Signed)
[place] MD was notified of Aline blood pressure reading above 200's and the wave form having a drop in it. MD requested to go off the blood pressure cuff instead of the Aline. Will continue to monitor. Patient is resting comfortably.
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
## 2019-12-14 NOTE — Transfer of Care (Signed)
Immediate Anesthesia Transfer of Care Note  Patient: Timothy Gardner  Procedure(s) Performed: THORACIC ENDOVASCULAR STENT GRAFT INSERTION (N/A Groin)  Patient Location: PACU  Anesthesia Type:General  Level of Consciousness: drowsy and patient cooperative  Airway & Oxygen Therapy: Patient Spontanous Breathing and Patient connected to face mask oxygen  Post-op Assessment: Report given to RN and Post -op Vital signs reviewed and stable  Post vital signs: Reviewed and stable  Last Vitals:  Vitals Value Taken Time  BP 129/68 12/14/19 1104  Temp    Pulse 95 12/14/19 1104  Resp 27 12/14/19 1104  SpO2 96 % 12/14/19 1104  Vitals shown include unvalidated device data.  Last Pain:  Vitals:   12/14/19 0841  TempSrc: Temporal  PainSc: 7          Complications: No complications documented.
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
## 2019-12-14 NOTE — Anesthesia Postprocedure Evaluation (Signed)
Anesthesia Post Note  Patient: Timothy Gardner  Procedure(s) Performed: THORACIC ENDOVASCULAR STENT GRAFT INSERTION (N/A Groin)     Patient location during evaluation: PACU Anesthesia Type: General Level of consciousness: awake and alert Pain management: pain level controlled Vital Signs Assessment: post-procedure vital signs reviewed and stable Respiratory status: spontaneous breathing, nonlabored ventilation, respiratory function stable and patient connected to nasal cannula oxygen Cardiovascular status: blood pressure returned to baseline and stable Postop Assessment: Gardner apparent nausea or vomiting Anesthetic complications: Gardner   Gardner complications documented.  Last Vitals:  Vitals:   12/14/19 1505 12/14/19 1605  BP: (!) 148/77 (!) 141/89  Pulse: 94 97  Resp: (!) 25 (!) 24  Temp:    SpO2: 96% 97%    Last Pain:  Vitals:   12/14/19 1605  TempSrc:   PainSc: 0-Gardner pain                 Earl Lites P Pharaoh Pio

## 2019-12-14 NOTE — ED Notes (Signed)
Dr Hawken at bedside  
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
## 2019-12-14 NOTE — ED Notes (Signed)
Called OR.  They are waiting for Covid test.
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
## 2019-12-14 NOTE — ED Notes (Signed)
Pt with large amount of emesis.  zofran given.

## 2019-12-14 NOTE — Anesthesia Procedure Notes (Signed)
Arterial Line Insertion Start/End12/11/2019 9:12 AM Performed by: Rachel Moulds, CRNA, CRNA  Patient location: OR. Preanesthetic checklist: patient identified, IV checked, site marked, risks and benefits discussed, surgical consent, monitors and equipment checked, pre-op evaluation, timeout performed and anesthesia consent Lidocaine 1% used for infiltration Right, radial was placed Catheter size: 20 G Hand hygiene performed , maximum sterile barriers used  and Seldinger technique used Allen's test indicative of satisfactory collateral circulation Attempts: 1 Procedure performed without using ultrasound guided technique. Following insertion, dressing applied and Biopatch. Post procedure assessment: normal and unchanged  Patient tolerated the procedure well with no immediate complications.
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
## 2019-12-14 NOTE — ED Notes (Signed)
Pt switched to [place].

## 2019-12-14 NOTE — ED Notes (Signed)
Cell phone and black watch placed in belongings bag.
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
## 2019-12-14 NOTE — ED Notes (Signed)
Dr Allen at bedside  

## 2019-12-14 NOTE — Anesthesia Procedure Notes (Signed)
Procedure Name: Intubation Date/Time: 12/14/2019 9:15 AM Performed by: Waynard Edwards, CRNA Pre-anesthesia Checklist: Patient identified, Emergency Drugs available, Suction available and Patient being monitored Patient Re-evaluated:Patient Re-evaluated prior to induction Oxygen Delivery Method: Circle system utilized Preoxygenation: Pre-oxygenation with 100% oxygen Induction Type: IV induction and Rapid sequence Laryngoscope Size: Glidescope and 4 Grade View: Grade I Tube type: Oral Tube size: 7.5 mm Number of attempts: 1 Airway Equipment and Method: Rigid stylet and Video-laryngoscopy Placement Confirmation: ETT inserted through vocal cords under direct vision,  positive ETCO2 and breath sounds checked- equal and bilateral Secured at: 22 cm Tube secured with: Tape Dental Injury: Teeth and Oropharynx as per pre-operative assessment  Difficulty Due To: Difficulty was anticipated and Difficult Airway- due to cervical collar
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
## 2019-12-15 ENCOUNTER — Inpatient Hospital Stay (HOSPITAL_COMMUNITY): Payer: BC Managed Care – PPO

## 2019-12-15 LAB — POCT I-STAT 7, (LYTES, BLD GAS, ICA,H+H)
Acid-base deficit: 5 mmol/L — ABNORMAL HIGH (ref 0.0–2.0)
Bicarbonate: 20.9 mmol/L (ref 20.0–28.0)
Calcium, Ion: 1.19 mmol/L (ref 1.15–1.40)
HCT: 28 % — ABNORMAL LOW (ref 39.0–52.0)
Hemoglobin: 9.5 g/dL — ABNORMAL LOW (ref 13.0–17.0)
O2 Saturation: 100 %
Potassium: 4 mmol/L (ref 3.5–5.1)
Sodium: 143 mmol/L (ref 135–145)
TCO2: 22 mmol/L (ref 22–32)
pCO2 arterial: 43.6 mmHg (ref 32.0–48.0)
pH, Arterial: 7.289 — ABNORMAL LOW (ref 7.350–7.450)
pO2, Arterial: 208 mmHg — ABNORMAL HIGH (ref 83.0–108.0)

## 2019-12-15 LAB — CBC
HCT: 24.9 % — ABNORMAL LOW (ref 39.0–52.0)
HCT: 26.2 % — ABNORMAL LOW (ref 39.0–52.0)
Hemoglobin: 6.9 g/dL — CL (ref 13.0–17.0)
Hemoglobin: 7.3 g/dL — ABNORMAL LOW (ref 13.0–17.0)
MCH: 17.9 pg — ABNORMAL LOW (ref 26.0–34.0)
MCH: 17.9 pg — ABNORMAL LOW (ref 26.0–34.0)
MCHC: 27.7 g/dL — ABNORMAL LOW (ref 30.0–36.0)
MCHC: 27.9 g/dL — ABNORMAL LOW (ref 30.0–36.0)
MCV: 64.5 fL — ABNORMAL LOW (ref 80.0–100.0)
MCV: 64.2 fL — ABNORMAL LOW (ref 80.0–100.0)
Platelets: 245 10*3/uL (ref 150–400)
Platelets: 241 10*3/uL (ref 150–400)
RBC: 3.86 MIL/uL — ABNORMAL LOW (ref 4.22–5.81)
RBC: 4.08 MIL/uL — ABNORMAL LOW (ref 4.22–5.81)
RDW: 18.3 % — ABNORMAL HIGH (ref 11.5–15.5)
RDW: 18.4 % — ABNORMAL HIGH (ref 11.5–15.5)
WBC: 15.6 10*3/uL — ABNORMAL HIGH (ref 4.0–10.5)
WBC: 15.1 10*3/uL — ABNORMAL HIGH (ref 4.0–10.5)
nRBC: 0 % (ref 0.0–0.2)
nRBC: 0 % (ref 0.0–0.2)

## 2019-12-15 LAB — BASIC METABOLIC PANEL
Anion gap: 10 (ref 5–15)
BUN: 9 mg/dL (ref 6–20)
CO2: 22 mmol/L (ref 22–32)
Calcium: 8.1 mg/dL — ABNORMAL LOW (ref 8.9–10.3)
Chloride: 106 mmol/L (ref 98–111)
Creatinine, Ser: 0.7 mg/dL (ref 0.61–1.24)
GFR, Estimated: >60 mL/min (ref >60)
Glucose, Bld: 115 mg/dL — ABNORMAL HIGH (ref 70–99)
Potassium: 3.3 mmol/L — ABNORMAL LOW (ref 3.5–5.1)
Sodium: 138 mmol/L (ref 135–145)

## 2019-12-15 LAB — POCT ACTIVATED CLOTTING TIME
Activated Clotting Time: 160 s
Activated Clotting Time: 190 s

## 2019-12-15 MED ORDER — LABETALOL HCL 5 MG/ML IV SOLN
10.0000 mg | INTRAVENOUS | Status: DC | PRN
  Administered 2019-12-15 – 2019-12-16 (×3): 10 mg via INTRAVENOUS
  Filled 2019-12-15 (×3): qty 4

## 2019-12-15 MED ORDER — CALCIUM CARBONATE ANTACID 500 MG PO CHEW
2.0000 | CHEWABLE_TABLET | Freq: Three times a day (TID) | ORAL | Status: DC | PRN
  Administered 2019-12-15: 200 mg via ORAL
  Filled 2019-12-15: qty 2

## 2019-12-15 MED ORDER — MORPHINE SULFATE (PF) 2 MG/ML IV SOLN
2.0000 mg | INTRAVENOUS | Status: DC | PRN
  Administered 2019-12-15 – 2019-12-18 (×2): 2 mg via INTRAVENOUS
  Filled 2019-12-15 (×2): qty 1
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
## 2019-12-15 NOTE — Progress Notes (Signed)
Paged Dr. Freida Busman to clarify blood pressure goals for patient. Patient's blood pressure goal to be less than 160 systolic. New orders for PRN IV labetalol 10 mg every 2 hours. Will continue to monitor.

## 2019-12-16 ENCOUNTER — Encounter (HOSPITAL_COMMUNITY): Payer: Self-pay | Admitting: Vascular Surgery

## 2019-12-16 ENCOUNTER — Other Ambulatory Visit: Payer: Self-pay

## 2019-12-16 ENCOUNTER — Inpatient Hospital Stay (HOSPITAL_COMMUNITY): Payer: BC Managed Care – PPO

## 2019-12-16 LAB — BASIC METABOLIC PANEL
Anion gap: 10 (ref 5–15)
BUN: 6 mg/dL (ref 6–20)
CO2: 25 mmol/L (ref 22–32)
Calcium: 8.8 mg/dL — ABNORMAL LOW (ref 8.9–10.3)
Chloride: 102 mmol/L (ref 98–111)
Creatinine, Ser: 0.67 mg/dL (ref 0.61–1.24)
GFR, Estimated: >60 mL/min (ref >60)
Glucose, Bld: 112 mg/dL — ABNORMAL HIGH (ref 70–99)
Potassium: 3.5 mmol/L (ref 3.5–5.1)
Sodium: 137 mmol/L (ref 135–145)

## 2019-12-16 LAB — MAGNESIUM: Magnesium: 2 mg/dL (ref 1.7–2.4)

## 2019-12-16 LAB — CBC
HCT: 26.7 % — ABNORMAL LOW (ref 39.0–52.0)
Hemoglobin: 7.7 g/dL — ABNORMAL LOW (ref 13.0–17.0)
MCH: 18.2 pg — ABNORMAL LOW (ref 26.0–34.0)
MCHC: 28.8 g/dL — ABNORMAL LOW (ref 30.0–36.0)
MCV: 63.3 fL — ABNORMAL LOW (ref 80.0–100.0)
Platelets: 268 10*3/uL (ref 150–400)
RBC: 4.22 MIL/uL (ref 4.22–5.81)
RDW: 18.6 % — ABNORMAL HIGH (ref 11.5–15.5)
WBC: 12.7 10*3/uL — ABNORMAL HIGH (ref 4.0–10.5)
nRBC: 0 % (ref 0.0–0.2)

## 2019-12-16 MED ORDER — ACETAMINOPHEN 325 MG PO TABS
650.0000 mg | ORAL_TABLET | Freq: Four times a day (QID) | ORAL | Status: DC
  Administered 2019-12-16 – 2019-12-18 (×11): 650 mg via ORAL
  Filled 2019-12-16 (×11): qty 2

## 2019-12-16 MED ORDER — ASPIRIN EC 81 MG PO TBEC: 81.0000 mg | DELAYED_RELEASE_TABLET | Freq: Every day | ORAL | Status: DC

## 2019-12-16 MED ORDER — ASPIRIN 81 MG PO CHEW
81.0000 mg | CHEWABLE_TABLET | Freq: Every day | ORAL | Status: DC
Start: 2019-12-17 — End: 2019-12-19
  Administered 2019-12-17 – 2019-12-18 (×2): 81 mg via ORAL
  Filled 2019-12-16 (×2): qty 1

## 2019-12-16 MED ORDER — DOCUSATE SODIUM 50 MG/5ML PO LIQD
100.0000 mg | Freq: Two times a day (BID) | ORAL | Status: DC
  Administered 2019-12-16 – 2019-12-18 (×6): 100 mg via ORAL
  Filled 2019-12-16 (×6): qty 10

## 2019-12-16 MED ORDER — HYDRALAZINE HCL 20 MG/ML IJ SOLN
10.0000 mg | Freq: Four times a day (QID) | INTRAMUSCULAR | Status: DC | PRN
  Administered 2019-12-17 – 2019-12-18 (×3): 10 mg via INTRAVENOUS
  Filled 2019-12-16 (×3): qty 1

## 2019-12-16 MED ORDER — METHOCARBAMOL 500 MG PO TABS
500.0000 mg | ORAL_TABLET | Freq: Three times a day (TID) | ORAL | Status: DC
  Administered 2019-12-16 – 2019-12-18 (×9): 500 mg via ORAL
  Filled 2019-12-16 (×10): qty 1

## 2019-12-16 MED ORDER — LABETALOL HCL 5 MG/ML IV SOLN
10.0000 mg | INTRAVENOUS | Status: DC | PRN
  Administered 2019-12-17 – 2019-12-18 (×3): 10 mg via INTRAVENOUS
  Filled 2019-12-16 (×3): qty 4

## 2019-12-16 MED ORDER — HYDRALAZINE HCL 20 MG/ML IJ SOLN: 10.0000 mg | Freq: Four times a day (QID) | INTRAMUSCULAR | Status: DC | PRN

## 2019-12-16 NOTE — Progress Notes (Signed)
Pt SBP remains higher than the goal of less than 160 systolic. Prn labetalol administered ineffective. Trauma MD on-call paged and notified with no response. Awaiting on MD. Will continue to closely monitor pt. Pt resting comfortably in bed with call light within reach. Arabella Merles Mirabelle Cyphers RN   12/16/19 0516  Vitals  BP (!) 163/82  MAP (mmHg) 102  BP Location Right Arm  BP Method Automatic  Patient Position (if appropriate) Lying  Pulse Rate 95  Pulse Rate Source Monitor  Resp 18  MEWS COLOR  MEWS Score Color Green  Oxygen Therapy  SpO2 100 %  O2 Device Room Air  MEWS Score  MEWS Temp 0  MEWS Systolic 0  MEWS Pulse 0  MEWS RR 0  MEWS LOC 0  MEWS Score 0

## 2019-12-16 NOTE — Plan of Care (Signed)
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
## 2019-12-16 NOTE — Progress Notes (Signed)
Orthopedic Tech Progress Note Patient Details:  Timothy Gardner 12-15-1985 [id number]  Ortho Devices Type of Ortho Device: [place] cervical collar Ortho Device/Splint Interventions: Ordered,Delivered to patient bed side       [name] 12/16/2019, 12:23 PM
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
## 2019-12-16 NOTE — Plan of Care (Signed)
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
## 2019-12-17 LAB — CBC
HCT: 27.3 % — ABNORMAL LOW (ref 39.0–52.0)
Hemoglobin: 8 g/dL — ABNORMAL LOW (ref 13.0–17.0)
MCH: 18.6 pg — ABNORMAL LOW (ref 26.0–34.0)
MCHC: 29.3 g/dL — ABNORMAL LOW (ref 30.0–36.0)
MCV: 63.3 fL — ABNORMAL LOW (ref 80.0–100.0)
Platelets: 252 10*3/uL (ref 150–400)
RBC: 4.31 MIL/uL (ref 4.22–5.81)
RDW: 18.6 % — ABNORMAL HIGH (ref 11.5–15.5)
WBC: 9.9 10*3/uL (ref 4.0–10.5)
nRBC: 0 % (ref 0.0–0.2)

## 2019-12-17 NOTE — Plan of Care (Signed)
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
## 2019-12-17 NOTE — TOC Progression Note (Signed)
Transition of Care [facility]) - Progression Note    Patient Details  Name: Timothy Gardner MRN: [id number] Date of Birth: [birth year]  [facility] CM/SW Contact  Glennon Mac, RN Phone Number: 12/17/2019, 4:47 PM  Clinical Narrative:  Current attempts at obtaining home health provider for patient are not going well due to insurance contracts,staffing issues, and liability concerns.  Pt may have to go to outpatient rehab facility for therapies.  Will provide update in AM.    Expected Discharge Plan: Home w Home Health Services Barriers to Discharge: Continued Medical Work up  Expected Discharge Plan and Services Expected Discharge Plan: Home w Home Health Services   Discharge Planning Services: CM Consult Post Acute Care Choice: Home Health Living arrangements for the past 2 months: Single Family Home                                       Social Determinants of Health (SDOH) Interventions    Readmission Risk Interventions No flowsheet data found.  Quintella Baton, RN, BSN  Trauma/Neuro ICU Case Manager [phone]

## 2019-12-18 ENCOUNTER — Other Ambulatory Visit (HOSPITAL_COMMUNITY): Payer: Self-pay | Admitting: Physician Assistant

## 2019-12-18 LAB — BPAM RBC
Blood Product Expiration Date: 202201042359
Blood Product Expiration Date: 202201102359
ISSUE DATE / TIME: 202112110925
ISSUE DATE / TIME: 202112110925
Unit Type and Rh: 5100
Unit Type and Rh: 5100

## 2019-12-18 LAB — BASIC METABOLIC PANEL
Anion gap: 10 (ref 5–15)
BUN: 8 mg/dL (ref 6–20)
CO2: 23 mmol/L (ref 22–32)
Calcium: 9.1 mg/dL (ref 8.9–10.3)
Chloride: 105 mmol/L (ref 98–111)
Creatinine, Ser: 0.69 mg/dL (ref 0.61–1.24)
GFR, Estimated: >60 mL/min (ref >60)
Glucose, Bld: 95 mg/dL (ref 70–99)
Potassium: 3.6 mmol/L (ref 3.5–5.1)
Sodium: 138 mmol/L (ref 135–145)

## 2019-12-18 LAB — TYPE AND SCREEN
ABO/RH(D): O POS
Antibody Screen: NEGATIVE
Unit division: 0
Unit division: 0

## 2019-12-18 LAB — CBC
HCT: 30 % — ABNORMAL LOW (ref 39.0–52.0)
Hemoglobin: 8.7 g/dL — ABNORMAL LOW (ref 13.0–17.0)
MCH: 18.4 pg — ABNORMAL LOW (ref 26.0–34.0)
MCHC: 29 g/dL — ABNORMAL LOW (ref 30.0–36.0)
MCV: 63.3 fL — ABNORMAL LOW (ref 80.0–100.0)
Platelets: 315 10*3/uL (ref 150–400)
RBC: 4.74 MIL/uL (ref 4.22–5.81)
RDW: 19.2 % — ABNORMAL HIGH (ref 11.5–15.5)
WBC: 11 10*3/uL — ABNORMAL HIGH (ref 4.0–10.5)
nRBC: 0.4 % — ABNORMAL HIGH (ref 0.0–0.2)

## 2019-12-18 MED ORDER — ADULT MULTIVITAMIN W/MINERALS CH: 1.0000 | ORAL_TABLET | Freq: Every day | ORAL | Status: DC

## 2019-12-18 MED ORDER — ACETAMINOPHEN 500 MG PO TABS: 1000.0000 mg | ORAL_TABLET | Freq: Three times a day (TID) | ORAL | Status: DC | PRN

## 2019-12-18 MED ORDER — OXYCODONE HCL 5 MG PO TABS: 5.0000 mg | ORAL_TABLET | Freq: Four times a day (QID) | ORAL | 0 refills | Status: DC | PRN

## 2019-12-18 MED ORDER — HYDRALAZINE HCL 20 MG/ML IJ SOLN
5.0000 mg | Freq: Once | INTRAMUSCULAR | Status: AC
  Administered 2019-12-18: 5 mg via INTRAVENOUS
  Filled 2019-12-18: qty 1

## 2019-12-18 MED ORDER — ASPIRIN 81 MG PO CHEW: 81.0000 mg | CHEWABLE_TABLET | Freq: Every day | ORAL | 0 refills | Status: DC

## 2019-12-18 MED ORDER — METHOCARBAMOL 500 MG PO TABS: 500.0000 mg | ORAL_TABLET | Freq: Three times a day (TID) | ORAL | 0 refills | Status: DC | PRN

## 2019-12-18 MED ORDER — DOCUSATE SODIUM 50 MG/5ML PO LIQD: 100.0000 mg | Freq: Two times a day (BID) | ORAL | 0 refills | Status: DC | PRN

## 2019-12-18 MED FILL — oxyCODONE HCL 5 MG TABS: 5 | 3 days supply | Qty: 15 | Fill #0

## 2019-12-18 MED FILL — ASPIRIN LOW DOSE 81 MG CHEW: 81 | 90 days supply | Qty: 90 | Fill #0

## 2019-12-18 MED FILL — METHOCARBAMOL 500 MG TABS: 500 | 15 days supply | Qty: 45 | Fill #0

## 2019-12-18 NOTE — Plan of Care (Signed)
Education on BP control and follow up with Pt PCP re enforced.  Incision care , pain control , safety and medications compliance  teaching given  Pt demonstrated   good understanding with a teach back.
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
## 2019-12-18 NOTE — Progress Notes (Signed)
Pt ambulated 540 ft independently  in hallways with RN supervision. Pt denies any pain or discomfort and voices relieve and feeling good with ambulation. Pt back to room, sitting up in chair with call light within reach. Will continue to closely monitor. Dionne Bucy RN

## 2019-12-18 NOTE — Discharge Instructions (Signed)
Rib Fracture  A rib fracture is a break or crack in one of the bones of the ribs. The ribs are like a cage that goes around your upper chest. A broken or cracked rib is often painful, but most do not cause other problems. Most rib fractures usually heal on their own in 1-3 months. Follow these instructions at home: Managing pain, stiffness, and swelling  If directed, apply ice to the injured area. ? Put ice in a plastic bag. ? Place a towel between your skin and the bag. ? Leave the ice on for 20 minutes, 2-3 times a day.  Take over-the-counter and prescription medicines only as told by your doctor. Activity  Avoid activities that cause pain to the injured area. Protect your injured area.  Slowly increase activity as told by your doctor. General instructions  Do deep breathing as told by your doctor. You may be told to: ? Take deep breaths many times a day. ? Cough many times a day while hugging a pillow. ? Use a device (incentive spirometer) to do deep breathing many times a day.  Drink enough fluid to keep your pee (urine) clear or pale yellow.  Do not wear a rib belt or binder. These do not allow you to breathe deeply.  Keep all follow-up visits as told by your doctor. This is important. Contact a doctor if:  You have a fever. Get help right away if:  You have trouble breathing.  You are short of breath.  You cannot stop coughing.  You cough up thick or bloody spit (sputum).  You feel sick to your stomach (nauseous), throw up (vomit), or have belly (abdominal) pain.  Your pain gets worse and medicine does not help. Summary  A rib fracture is a break or crack in one of the bones of the ribs.  Apply ice to the injured area and take medicines for pain as told by your doctor.  Take deep breaths and cough many times a day. Hug a pillow every time you cough. This information is not intended to replace advice given to you by your health care provider. Make sure you  discuss any questions you have with your health care provider. Document Revised: 12/02/2016 Document Reviewed: 03/22/2016 Elsevier Patient Education  2020 [employer].   How to Use a Hard Cervical Collar A hard cervical collar limits the movement of the top part of your spine (cervical spine). The collar holds your head and neck in a straight position. A cervical collar may be used to treat:  A fracture in the neck.  Damage to the ligaments. Ligaments are tissues that connect bones.  Injury to the spinal cord. There are several types of hard cervical collars. Follow the manufacturer's instructions for use. These are general guidelines. What are the risks? Wearing a cervical collar is safe. However, problems may occur, including:  Skin breakdown.  Sores that form due to rubbing or pressure on the skin (pressure ulcers).  Pain.  Difficulty breathing.  Worsening of your condition if the collar is not placed correctly.  Increased risk of inhaling food or liquid into your lungs (aspiration). Supplies needed:  Extra cervical collar and replacement pads.  Ice.  Plastic bag.  Towel.  A watertight covering to put over the collar during bathing, if needed. How to use a cervical collar  Wear the cervical collar as told by your health care provider. Do not remove it unless told to do so by your health care provider.  You may be directed to remove it only when you check your skin and change the pads. While the collar is off: ? Ask another person to assist you if needed. ? Keep your head and neck straight.  Do not bend your neck or turn your head. ? Check your skin daily for red areas. Ask for help or use a mirror to check areas you cannot see. ? After checking your skin, wear the extra cervical collar while cleaning and changing the pads of the other collar.  Change the pads daily or more often if they become wet or dirty. Keep a clean set of replacement pads.  Do not let hard  plastic edges touch your skin. Cover them with a soft pad.  If your cervical collar is not waterproof: ? Do not let it get wet. ? Cover it with a watertight covering when you take a bath or a shower. Follow these instructions at home:   Put ice on the injured area to manage pain, stiffness, and swelling. ? Do not remove your cervical collar unless told to do so by your health care provider. ? Put ice in a plastic bag. ? Place a towel between your skin and the bag or between your cervical collar and the bag. ? Leave the ice on for 20 minutes, 2-3 times a day for the first 2 days after your injury.  Do not drive any vehicle until your health care provider approves.  Keep all follow-up visits as told by your health care provider. This is important. Any delay in getting the care that you need can prevent proper healing of your injury. Contact a health care provider if you have:  Red areas of skin under your cervical collar.  Pain that is not controlled with your medicines. Get help right away if you have:  Numbness or weakness in your arms or legs.  Difficulty breathing. These symptoms may represent a serious problem that is an emergency. Do not wait to see if the symptoms will go away. Get medical help right away. Call your local emergency services (911 in the [place] Do not drive yourself to the hospital. Summary  A cervical collar is a device that supports the chin and the back of the head. It restricts movement of the neck to prevent more damage after a severe injury.  A cervical collar may be used to treat a fracture in the neck, damage to tissues that hold bones together (ligaments), or injury to the spinal cord.  Wear the cervical collar as told by your health care provider. Ask if you may remove the collar to shower, bathe, or eat, or to put ice on your neck. This information is not intended to replace advice given to you by your health care provider. Make sure you discuss any  questions you have with your health care provider. Document Revised: 10/08/2018 Document Reviewed: 11/11/2016 Elsevier Patient Education  2020 [employer].   [facility]   Discharge Instructions  Endovascular Aortic Aneurysm Repair  Please refer to the following instructions for your post-procedure care. Your surgeon or Physician Assistant will discuss any changes with you.  Activity  You are encouraged to walk as much as you can. You can slowly return to normal activities but must avoid strenuous activity and heavy lifting until your doctor tells you it's OK. Avoid activities such as vacuuming or swinging a gold club. It is normal to feel tired for several weeks after your surgery. Do  not drive until your doctor gives the OK and you are no longer taking prescription pain medications. It is also normal to have difficulty with sleep habits, eating, and bowel movements after surgery. These will go away with time.  Bathing/Showering  Shower daily after you go home.  Do not soak in a bathtub, hot tub, or swim until the incision heals completely.  If you have incisions in your groin, wash the groin wounds with soap and water daily and pat dry. (No tub bath-only shower)  Then put a dry gauze or washcloth there to keep this area dry to help prevent wound infection daily and as needed.  Do not use Vaseline or neosporin on your incisions.  Only use soap and water on your incisions and then protect and keep dry.  Incision Care  Shower every day. Clean your incision with mild soap and water. Pat the area dry with a clean towel. You do not need a bandage unless otherwise instructed. Do not apply any ointments or creams to your incision. If you clothing is irritating, you may cover your incision with a dry gauze pad.  Diet  Resume your normal diet. There are no special food restrictions following this procedure. A low fat/low cholesterol diet is recommended for all  patients with vascular disease. In order to heal from your surgery, it is CRITICAL to get adequate nutrition. Your body requires vitamins, minerals, and protein. Vegetables are the best source of vitamins and minerals. Vegetables also provide the perfect balance of protein. Processed food has little nutritional value, so try to avoid this.  Medications  Resume taking all of your medications unless your doctor or nurse practitioner tells you not to. If your incision is causing pain, you may take over-the-counter pain relievers such as acetaminophen (Tylenol). If you were prescribed a stronger pain medication, please be aware these medications can cause nausea and constipation. Prevent nausea by taking the medication with a snack or meal. Avoid constipation by drinking plenty of fluids and eating foods with a high amount of fiber, such as fruits, vegetables, and grains.  Do not take Tylenol if you are taking prescription pain medications.   Follow up  Our office will schedule a follow-up appointment with a CT scan 3-4 weeks after your surgery.  Please call us immediately for any of the following conditions  . Severe or worsening pain in your legs or feet or in your abdomen back or chest. . Increased pain, redness, drainage (pus) from your incision site. . Increased abdominal pain, bloating, nausea, vomiting or persistent diarrhea. . Fever of 101 degrees or higher. . Swelling in your leg (s), .  Reduce your risk of vascular disease  .Stop smoking. If you would like help call [facility] at 1-800-QUIT-NOW (819-595-36821-279 664 8386) or [place] at [phone]. .Manage your cholesterol .Maintain a desired weight .Control your diabetes .Keep your blood pressure down  If you have questions, please call the office at [phone].    Laceration Care, Adult A laceration is a cut that may go through all layers of the skin. The cut may also go into the tissue that is right under the skin. Some cuts  heal on their own. Others need to be closed with stitches (sutures), staples, skin adhesive strips, or skin glue. Taking care of your injury lowers your risk of infection, helps your injury to heal better, and may prevent scarring. Supplies needed:  Soap.  Water.  Hand sanitizer.  Bandage (dressing).  Antibiotic ointment.  Clean towel. How  to take care of your cut Wash your hands with soap and water before touching your wound or changing your bandage. If soap and water are not available, use hand sanitizer. If your doctor used stitches or staples:  Keep the wound clean and dry.  If you were given a bandage, change it at least once a day as told by your doctor. You should also change it if it gets wet or dirty.  Keep the wound completely dry for the first 24 hours, or as told by your doctor. After that, you may take a shower or a bath. Do not get the wound soaked in water until after the stitches or staples have been removed.  Clean the wound once a day, or as told by your doctor: ? Wash the wound with soap and water. ? Rinse the wound with water to remove all soap. ? Pat the wound dry with a clean towel. Do not rub the wound.  After you clean the wound, put a thin layer of antibiotic ointment on it as told by your doctor. This ointment: ? Helps to prevent infection. ? Keeps the bandage from sticking to the wound.  Have your stitches or staples removed as told by your doctor. If your doctor used skin adhesive strips:  Keep the wound clean and dry.  If you were given a bandage, you should change it at least once a day as told by your doctor. You should also change it if it gets wet or dirty.  Do not get the skin adhesive strips wet. You can take a shower or a bath, but keep the wound dry.  If the wound gets wet, pat it dry with a clean towel. Do not rub the wound.  Skin adhesive strips fall off on their own. You can trim the strips as the wound heals. Do not remove any  strips that are still stuck to the wound. They will fall off after a while. If your doctor used skin glue:  Try to keep your wound dry, but you may briefly wet it in the shower or bath. Do not soak the wound in water, such as by swimming.  After you take a shower or a bath, gently pat the wound dry with a clean towel. Do not rub the wound.  Do not do any activities that will make you really sweaty until the skin glue has fallen off on its own.  Do not apply liquid, cream, or ointment medicine to your wound while the skin glue is still on.  If you were given a bandage, you should change it at least once a day or as told by your doctor. You should also change it if it gets dirty or wet.  If a bandage is placed over the wound, do not let the tape touch the skin glue.  Do not pick at the glue. The skin glue usually stays on for 5-10 days. Then, it falls off the skin. General instructions   Take over-the-counter and prescription medicines only as told by your doctor.  If you were given antibiotic medicine or ointment, take or apply it as told by your doctor. Do not stop using it even if your condition improves.  Do not scratch or pick at the wound.  Check your wound every day for signs of infection. Watch for: ? Redness, swelling, or pain. ? Fluid, blood, or pus.  Raise (elevate) the injured area above the level of your heart while you are sitting or lying  down.  If directed, put ice on the affected area: ? Put ice in a plastic bag. ? Place a towel between your skin and the bag. ? Leave the ice on for 20 minutes, 2-3 times a day.  Prevent scarring by covering your wound with sunscreen of at least 30 SPF whenever you are outside after your wound has healed.  Keep all follow-up visits as told by your doctor. This is important. Get help if:  You got a tetanus shot and you have any of these problems at the injection site: ? Swelling. ? Very bad pain. ? Redness. ? Bleeding.  You  have a fever.  A wound that was closed breaks open.  You notice a bad smell coming from your wound or your bandage.  You notice something coming out of the wound, such as wood or glass.  Medicine does not relieve your pain.  You have more redness, swelling, or pain at the site of your wound.  You have fluid, blood, or pus coming from your wound.  You notice a change in the color of your skin near your wound.  You need to change the bandage often because fluid, blood, or pus is coming from the wound.  You start to have a new rash.  You start to have numbness around the wound. Get help right away if:  You have very bad swelling around the wound.  Your pain suddenly gets worse and is very bad.  You notice painful lumps near the wound or anywhere on your body.  You have a red streak going away from your wound.  The wound is on your hand or foot, and: ? You cannot move a finger or toe. ? Your fingers or toes look pale or bluish. Summary  A laceration is a cut that may go through all layers of the skin. The cut may also go into the tissue right under the skin.  Some cuts heal on their own. Others need to be closed with stitches, staples, skin adhesive strips, or skin glue.  Follow your doctor's instructions for caring for your cut. Proper care of a cut lowers the risk of infection, helps the cut heal better, and prevents scarring. This information is not intended to replace advice given to you by your health care provider. Make sure you discuss any questions you have with your health care provider. Document Revised: 02/17/2017 Document Reviewed: 01/09/2017 Elsevier Patient Education  2020 [employer].
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
## 2019-12-18 NOTE — Progress Notes (Signed)
PATIENT  PRESSURE REMAINED HIGH .TRAUMA SERVICE  ON CALL  NOTIFIED  LABETALOL 10 MG  PRN AND HYDRALAZINE 5 MG GIVEN  bP DOWN FROM 191/98 TO 162/88 AND md STATED ITS OKAY FOR PT TO BE D/C HOME AND FOLLOW UP WITH PCP FOR BP MEDICATION AND BP CONTROL. BUT RETURN TO HOSPITAL IF PT EXPERIENCE SEVERE H/A UNCONTROLLED  BY PAIN MEDICATION   PT. D/C  HOME TO SELF CARE  AND FOLLOW UP WITH OUTPATIENT REHAB. CARDIOLOGY NEUROSURGERY AND  TRAUMA.
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
## 2019-12-18 NOTE — TOC Progression Note (Signed)
Transition of Care [facility]) - Progression Note    Patient Details  Name: Timothy Gardner MRN: [id number] Date of Birth: [birth year]  Transition of Care [facility]) CM/SW Contact  Glennon Mac, RN Phone Number: 12/18/2019, 3:02 PM  Clinical Narrative:   Spoke with patient to discuss outpatient therapy and DME needs.  PT/OT recommending home health follow-up, however unable to secure home health services due to insurance contracts and staffing issues with home health agencies.  Patient is agreeable to outpatient therapies at [facility] health outpatient rehab center in [place], and has transportation to appointments.  He states that he has a bedside commode for home use, and does not need walker or wheelchair.  He currently has been ambulating without assistance or assistive device.  Spoke with patient's mother, [name], with patient's permission, as she called to speak with me this morning about patient.  She is concerned about how long patient will need supervision and transport to appointments.  Ms. Eisenbeis states they are also caring for [name]'s 51-year-old child.  Encouraged patient's mother to discuss this with her son, as he is able to ask these questions of physician.  Patient states he has assistance from his sister and girlfriend as well as his parents.  Answered all of mother's questions to the best of my ability.    Expected Discharge Plan: OP Rehab Barriers to Discharge: Barriers Resolved  Expected Discharge Plan and Services Expected Discharge Plan: OP Rehab   Discharge Planning Services: CM Consult Post Acute Care Choice: Home Health Living arrangements for the past 2 months: Single Family Home                                       Social Determinants of Health (SDOH) Interventions    Readmission Risk Interventions Readmission Risk Prevention Plan 12/18/2019  Post Dischage Appt Complete  Medication Screening Complete  Transportation Screening Complete    Quintella Baton, RN, BSN  Trauma/Neuro ICU Case Manager [phone]
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
## 2019-12-18 NOTE — Progress Notes (Signed)
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  A cane, crutch, or walker will not resolve issue with performing activities of daily living. A wheelchair will allow patient to safely perform daily activities. Patient can safely propel the wheelchair in the home or has a caregiver who can provide assistance. Length of need 6 months . Accessories: elevating leg rests (ELRs), wheel locks, extensions and anti-tippers.

## 2019-12-20 ENCOUNTER — Encounter: Payer: Self-pay | Admitting: Family Medicine

## 2019-12-20 ENCOUNTER — Ambulatory Visit (INDEPENDENT_AMBULATORY_CARE_PROVIDER_SITE_OTHER): Payer: BC Managed Care – PPO | Admitting: Family Medicine

## 2019-12-20 ENCOUNTER — Other Ambulatory Visit: Payer: Self-pay

## 2019-12-20 VITALS — BP 174/100 | HR 97

## 2019-12-20 DIAGNOSIS — I10 Essential (primary) hypertension: Secondary | ICD-10-CM | POA: Diagnosis not present

## 2019-12-20 MED ORDER — METOPROLOL TARTRATE 25 MG PO TABS: ORAL_TABLET | ORAL | 3 refills | Status: DC
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
## 2019-12-25 ENCOUNTER — Telehealth: Payer: Self-pay | Admitting: Radiology

## 2019-12-25 NOTE — Telephone Encounter (Signed)
Patient has been made aware of the instructions, he will keep [place] posted on his BP levels

## 2019-12-25 NOTE — Telephone Encounter (Signed)
Patient called states that he is taking BP meds and he seems to be doing good with them, but his BP still elevated--Sunday 12/22/19 it was 158/82 @ 2:11pm, 151/86 @ 7:19pm,  Monday- 12/23/19-it was 154/72, 157/82 Tuesday -12/24/19- it was 166/79.  He states that before he takes his meds it is running in the 180-190 range. He states that he is taking 1/2 in the mornings and taking 1/2 at night.  Please advise.

## 2019-12-25 NOTE — Telephone Encounter (Signed)
I assume he's taking 1/2 tablet of the metoprolol twice daily.  If so, have him take 1 tablet twice daily.  If already on 1 tablet twice daily, then take 1-1/2 tablets twice daily.

## 2020-01-02 ENCOUNTER — Telehealth (HOSPITAL_COMMUNITY): Payer: Self-pay

## 2020-01-02 NOTE — Telephone Encounter (Signed)
All FMLA paper work was filled out and faxed to [employer] @ [phone]  [address]

## 2020-01-06 ENCOUNTER — Ambulatory Visit: Payer: BC Managed Care – PPO | Admitting: Physical Therapy

## 2020-01-06 ENCOUNTER — Ambulatory Visit: Payer: BC Managed Care – PPO | Attending: Physician Assistant | Admitting: Occupational Therapy

## 2020-01-06 ENCOUNTER — Other Ambulatory Visit: Payer: Self-pay

## 2020-01-06 ENCOUNTER — Encounter: Payer: Self-pay | Admitting: Occupational Therapy

## 2020-01-06 ENCOUNTER — Encounter: Payer: Self-pay | Admitting: Physical Therapy

## 2020-01-06 VITALS — BP 159/100

## 2020-01-06 DIAGNOSIS — R29898 Other symptoms and signs involving the musculoskeletal system: Secondary | ICD-10-CM | POA: Diagnosis present

## 2020-01-06 DIAGNOSIS — R29818 Other symptoms and signs involving the nervous system: Secondary | ICD-10-CM

## 2020-01-06 DIAGNOSIS — M6281 Muscle weakness (generalized): Secondary | ICD-10-CM | POA: Diagnosis present

## 2020-01-06 DIAGNOSIS — M546 Pain in thoracic spine: Secondary | ICD-10-CM | POA: Insufficient documentation

## 2020-01-06 DIAGNOSIS — R0781 Pleurodynia: Secondary | ICD-10-CM | POA: Diagnosis present

## 2020-01-06 DIAGNOSIS — M542 Cervicalgia: Secondary | ICD-10-CM | POA: Diagnosis present

## 2020-01-06 DIAGNOSIS — R252 Cramp and spasm: Secondary | ICD-10-CM
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
## 2020-01-06 NOTE — Patient Instructions (Signed)
Access Code: [email or web address] URL: [email or web address] Date: 01/06/2020 Prepared by: Lysle Rubens  Exercises Seated Scapular Retraction - 1 x daily - 7 x weekly - 3 sets - 10 reps - 3 sec hold Shoulder External Rotation and Scapular Retraction with Resistance - 1 x daily - 7 x weekly - 3 sets - 10 reps Standing Shoulder Row with Anchored Resistance - 1 x daily - 7 x weekly - 3 sets - 10 reps Shoulder Extension with Resistance - 1 x daily - 7 x weekly - 3 sets - 10 reps Seated Diaphragmatic Breathing - 1 x daily - 7 x weekly - 3 sets - 10 reps

## 2020-01-13 ENCOUNTER — Other Ambulatory Visit: Payer: Self-pay

## 2020-01-13 ENCOUNTER — Encounter: Payer: Self-pay | Admitting: Physical Therapy

## 2020-01-13 ENCOUNTER — Ambulatory Visit: Payer: BC Managed Care – PPO | Admitting: Physical Therapy

## 2020-01-13 DIAGNOSIS — M546 Pain in thoracic spine: Secondary | ICD-10-CM

## 2020-01-13 DIAGNOSIS — R252 Cramp and spasm: Secondary | ICD-10-CM

## 2020-01-13 DIAGNOSIS — R0781 Pleurodynia: Secondary | ICD-10-CM

## 2020-01-13 DIAGNOSIS — R29898 Other symptoms and signs involving the musculoskeletal system: Secondary | ICD-10-CM | POA: Diagnosis not present

## 2020-01-13 DIAGNOSIS — M542 Cervicalgia: Secondary | ICD-10-CM
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
## 2020-01-14 ENCOUNTER — Encounter: Payer: Self-pay | Admitting: Vascular Surgery

## 2020-01-14 ENCOUNTER — Ambulatory Visit (INDEPENDENT_AMBULATORY_CARE_PROVIDER_SITE_OTHER): Payer: Self-pay | Admitting: Vascular Surgery

## 2020-01-14 VITALS — BP 145/92 | HR 81 | Temp 98.1°F | Resp 20 | Ht 72.0 in | Wt 175.0 lb

## 2020-01-14 DIAGNOSIS — Z95828 Presence of other vascular implants and grafts: Secondary | ICD-10-CM
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
## 2020-01-15 ENCOUNTER — Other Ambulatory Visit: Payer: Self-pay | Admitting: *Deleted

## 2020-01-15 DIAGNOSIS — Z95828 Presence of other vascular implants and grafts: Secondary | ICD-10-CM

## 2020-01-20 ENCOUNTER — Ambulatory Visit: Payer: BC Managed Care – PPO | Admitting: Physical Therapy

## 2020-01-27 ENCOUNTER — Encounter: Payer: Self-pay | Admitting: Physical Therapy

## 2020-01-27 ENCOUNTER — Ambulatory Visit: Payer: BC Managed Care – PPO | Admitting: Physical Therapy

## 2020-01-27 ENCOUNTER — Other Ambulatory Visit: Payer: Self-pay

## 2020-01-27 DIAGNOSIS — M542 Cervicalgia: Secondary | ICD-10-CM

## 2020-01-27 DIAGNOSIS — R0781 Pleurodynia: Secondary | ICD-10-CM

## 2020-01-27 DIAGNOSIS — R29898 Other symptoms and signs involving the musculoskeletal system: Secondary | ICD-10-CM | POA: Diagnosis not present

## 2020-01-27 DIAGNOSIS — R252 Cramp and spasm: Secondary | ICD-10-CM

## 2020-01-27 DIAGNOSIS — M546 Pain in thoracic spine: Secondary | ICD-10-CM
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
## 2020-01-27 NOTE — Patient Instructions (Signed)
Access Code: [email or web address] URL: [email or web address] Date: 01/27/2020 Prepared by: Lysle Rubens  Exercises Seated Gentle Upper Trapezius Stretch - 1 x daily - 7 x weekly - 3 sets - 2 reps - 20-30 sec hold Seated Levator Scapulae Stretch - 1 x daily - 7 x weekly - 3 sets - 2 reps - 20-30 sec hold Supine Chin Tuck - 1 x daily - 7 x weekly - 3 sets - 10 reps - 3 sec hold Seated Cervical Rotation AROM - 1 x daily - 7 x weekly - 3 sets - 10 reps Seated Cervical Flexion AROM - 1 x daily - 7 x weekly - 3 sets - 10 reps

## 2020-02-03 ENCOUNTER — Encounter: Payer: Self-pay | Admitting: Physical Therapy

## 2020-02-03 ENCOUNTER — Other Ambulatory Visit: Payer: Self-pay

## 2020-02-03 ENCOUNTER — Ambulatory Visit: Payer: BC Managed Care – PPO | Admitting: Physical Therapy

## 2020-02-03 ENCOUNTER — Ambulatory Visit
Admission: RE | Admit: 2020-02-03 | Discharge: 2020-02-03 | Disposition: A | Discharge status: Home / Self Care | Payer: BC Managed Care – PPO | Source: Ambulatory Visit | Attending: Vascular Surgery | Admitting: Vascular Surgery

## 2020-02-03 VITALS — BP 155/94 | HR 116

## 2020-02-03 DIAGNOSIS — M546 Pain in thoracic spine: Secondary | ICD-10-CM

## 2020-02-03 DIAGNOSIS — M542 Cervicalgia: Secondary | ICD-10-CM

## 2020-02-03 DIAGNOSIS — R252 Cramp and spasm: Secondary | ICD-10-CM

## 2020-02-03 DIAGNOSIS — R29898 Other symptoms and signs involving the musculoskeletal system: Secondary | ICD-10-CM | POA: Diagnosis not present

## 2020-02-03 DIAGNOSIS — R0781 Pleurodynia: Secondary | ICD-10-CM

## 2020-02-03 DIAGNOSIS — Z95828 Presence of other vascular implants and grafts: Secondary | ICD-10-CM

## 2020-02-03 MED ORDER — IOPAMIDOL (ISOVUE-370) INJECTION 76%
75.0000 mL | Freq: Once | INTRAVENOUS | Status: AC | PRN
  Administered 2020-02-03: 75 mL via INTRAVENOUS
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
## 2020-02-10 ENCOUNTER — Ambulatory Visit: Payer: BC Managed Care – PPO | Attending: Physician Assistant | Admitting: Physical Therapy

## 2020-02-17 ENCOUNTER — Ambulatory Visit: Payer: BC Managed Care – PPO | Admitting: Physical Therapy

## 2020-04-09 ENCOUNTER — Telehealth: Payer: Self-pay | Admitting: Radiology

## 2020-04-09 MED ORDER — OMEPRAZOLE 40 MG PO CPDR: 40.0000 mg | DELAYED_RELEASE_CAPSULE | Freq: Every day | ORAL | 1 refills | Status: DC | PRN

## 2020-04-09 NOTE — Telephone Encounter (Signed)
Rx sent 

## 2020-04-09 NOTE — Telephone Encounter (Signed)
Patient called was wondering if Dr. Prince Rome could refill his Omeprazole 40mg , states that he takes is once a day. He states that he was getting it from Dr. but he has since retired. Please advise.

## 2020-04-09 NOTE — Telephone Encounter (Signed)
Patient is aware that this has been sent in to his pharmacy

## 2020-07-03 ENCOUNTER — Telehealth: Payer: Self-pay

## 2020-07-03 MED ORDER — METOPROLOL TARTRATE 25 MG PO TABS: 25.0000 mg | ORAL_TABLET | Freq: Two times a day (BID) | ORAL | 3 refills | Status: DC

## 2020-07-03 NOTE — Addendum Note (Signed)
Addended by: Lillia Carmel on: 07/03/2020 09:16 AM   Modules accepted: Orders

## 2020-07-03 NOTE — Telephone Encounter (Signed)
Requests refill on Metoprolol 25 mg --- taking 1 qd - bid and says his blood pressure readings have been good. [facility].

## 2020-09-17 ENCOUNTER — Other Ambulatory Visit: Payer: Self-pay | Admitting: Radiology

## 2020-09-18 MED ORDER — OMEPRAZOLE 40 MG PO CPDR: 40.0000 mg | DELAYED_RELEASE_CAPSULE | Freq: Every day | ORAL | 2 refills | Status: DC | PRN

## 2021-03-09 ENCOUNTER — Encounter: Payer: Self-pay | Admitting: Nurse Practitioner

## 2021-03-09 ENCOUNTER — Other Ambulatory Visit: Payer: Self-pay

## 2021-03-09 ENCOUNTER — Ambulatory Visit (INDEPENDENT_AMBULATORY_CARE_PROVIDER_SITE_OTHER): Payer: 59 | Admitting: Nurse Practitioner

## 2021-03-09 VITALS — BP 151/80 | HR 75 | Temp 98.2°F | Ht 68.0 in | Wt 192.4 lb

## 2021-03-09 DIAGNOSIS — E663 Overweight: Secondary | ICD-10-CM | POA: Diagnosis not present

## 2021-03-09 DIAGNOSIS — K224 Dyskinesia of esophagus: Secondary | ICD-10-CM | POA: Diagnosis not present

## 2021-03-09 DIAGNOSIS — Z7689 Persons encountering health services in other specified circumstances: Secondary | ICD-10-CM

## 2021-03-09 DIAGNOSIS — Z8679 Personal history of other diseases of the circulatory system: Secondary | ICD-10-CM | POA: Diagnosis not present

## 2021-03-09 DIAGNOSIS — I10 Essential (primary) hypertension: Secondary | ICD-10-CM

## 2021-03-09 MED ORDER — IRBESARTAN 150 MG PO TABS: 150.0000 mg | ORAL_TABLET | Freq: Every day | ORAL | 1 refills | Status: DC

## 2021-03-09 MED ORDER — METOPROLOL TARTRATE 25 MG PO TABS: 25.0000 mg | ORAL_TABLET | Freq: Two times a day (BID) | ORAL | 3 refills | Status: DC
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
## 2021-06-14 ENCOUNTER — Other Ambulatory Visit: Payer: Self-pay | Admitting: Family

## 2021-06-22 ENCOUNTER — Other Ambulatory Visit: Payer: Self-pay | Admitting: Nurse Practitioner

## 2021-06-22 ENCOUNTER — Telehealth: Payer: Self-pay | Admitting: Nurse Practitioner

## 2021-06-22 DIAGNOSIS — K224 Dyskinesia of esophagus: Secondary | ICD-10-CM

## 2021-06-22 MED ORDER — OMEPRAZOLE 40 MG PO CPDR: 40.0000 mg | DELAYED_RELEASE_CAPSULE | Freq: Every day | ORAL | 0 refills | Status: DC | PRN

## 2021-06-22 NOTE — Telephone Encounter (Signed)
Patient has an appointment on 07/09/21 but will not have enough Omeprazole to last until then. Can you please send in enough until his appointment?

## 2021-06-22 NOTE — Telephone Encounter (Signed)
Ok thanks 

## 2021-06-22 NOTE — Telephone Encounter (Signed)
I sent a 30 day prescription of imeprazole to [facility] for him.

## 2021-06-29 ENCOUNTER — Other Ambulatory Visit: Payer: Self-pay

## 2021-06-29 DIAGNOSIS — Z13228 Encounter for screening for other metabolic disorders: Secondary | ICD-10-CM

## 2021-06-29 DIAGNOSIS — Z0000 Encounter for general adult medical examination without abnormal findings: Secondary | ICD-10-CM

## 2021-07-02 ENCOUNTER — Other Ambulatory Visit: Payer: 59

## 2021-07-02 DIAGNOSIS — Z1321 Encounter for screening for nutritional disorder: Secondary | ICD-10-CM | POA: Diagnosis not present

## 2021-07-02 DIAGNOSIS — Z130 Encounter for screening for diseases of the blood and blood-forming organs and certain disorders involving the immune mechanism: Secondary | ICD-10-CM | POA: Diagnosis not present

## 2021-07-02 DIAGNOSIS — Z13228 Encounter for screening for other metabolic disorders: Secondary | ICD-10-CM | POA: Diagnosis not present

## 2021-07-02 DIAGNOSIS — Z1329 Encounter for screening for other suspected endocrine disorder: Secondary | ICD-10-CM | POA: Diagnosis not present

## 2021-07-02 DIAGNOSIS — Z0000 Encounter for general adult medical examination without abnormal findings: Secondary | ICD-10-CM | POA: Diagnosis not present

## 2021-07-03 LAB — CBC
Hematocrit: 34.2 % — ABNORMAL LOW (ref 37.5–51.0)
Hemoglobin: 9.4 g/dL — ABNORMAL LOW (ref 13.0–17.7)
MCH: 16.9 pg — ABNORMAL LOW (ref 26.6–33.0)
MCHC: 27.5 g/dL — ABNORMAL LOW (ref 31.5–35.7)
MCV: 61 fL — ABNORMAL LOW (ref 79–97)
Platelets: 354 10*3/uL (ref 150–450)
RBC: 5.57 x10E6/uL (ref 4.14–5.80)
RDW: 20.1 % — ABNORMAL HIGH (ref 11.6–15.4)
WBC: 6.4 10*3/uL (ref 3.4–10.8)

## 2021-07-03 LAB — COMPREHENSIVE METABOLIC PANEL
ALT: 31 IU/L (ref 0–44)
AST: 26 IU/L (ref 0–40)
Albumin/Globulin Ratio: 1.7 (ref 1.2–2.2)
Albumin: 4.5 g/dL (ref 4.0–5.0)
Alkaline Phosphatase: 91 IU/L (ref 44–121)
BUN/Creatinine Ratio: 10 (ref 9–20)
BUN: 10 mg/dL (ref 6–20)
Bilirubin Total: 0.3 mg/dL (ref 0.0–1.2)
CO2: 22 mmol/L (ref 20–29)
Calcium: 9 mg/dL (ref 8.7–10.2)
Chloride: 105 mmol/L (ref 96–106)
Creatinine, Ser: 1.05 mg/dL (ref 0.76–1.27)
Globulin, Total: 2.6 g/dL (ref 1.5–4.5)
Glucose: 112 mg/dL — ABNORMAL HIGH (ref 70–99)
Potassium: 3.9 mmol/L (ref 3.5–5.2)
Sodium: 142 mmol/L (ref 134–144)
Total Protein: 7.1 g/dL (ref 6.0–8.5)
eGFR: 95 mL/min/{1.73_m2} (ref >59)

## 2021-07-03 LAB — HEMOGLOBIN A1C
Est. average glucose Bld gHb Est-mCnc: 120 mg/dL
Hgb A1c MFr Bld: 5.8 % — ABNORMAL HIGH (ref 4.8–5.6)

## 2021-07-03 LAB — LIPID PANEL
Chol/HDL Ratio: 3.3 ratio (ref 0.0–5.0)
Cholesterol, Total: 148 mg/dL (ref 100–199)
HDL: 45 mg/dL (ref >39)
LDL Chol Calc (NIH): 76 mg/dL (ref 0–99)
Triglycerides: 160 mg/dL — ABNORMAL HIGH (ref 0–149)
VLDL Cholesterol Cal: 27 mg/dL (ref 5–40)

## 2021-07-03 LAB — TSH: TSH: 0.788 u[IU]/mL (ref 0.450–4.500)

## 2021-07-04 NOTE — Progress Notes (Signed)
Moderate, stable anemia. Other lab essentially normal. Discuss with patient during visit 07/15/2021

## 2021-07-08 ENCOUNTER — Other Ambulatory Visit (HOSPITAL_COMMUNITY): Payer: Self-pay

## 2021-07-09 ENCOUNTER — Encounter: Payer: 59 | Admitting: Nurse Practitioner
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
## 2021-07-15 ENCOUNTER — Encounter: Payer: 59 | Admitting: Nurse Practitioner
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
## 2021-07-26 ENCOUNTER — Other Ambulatory Visit: Payer: Self-pay

## 2021-07-26 DIAGNOSIS — K224 Dyskinesia of esophagus: Secondary | ICD-10-CM

## 2021-07-26 MED ORDER — OMEPRAZOLE 40 MG PO CPDR: 40.0000 mg | DELAYED_RELEASE_CAPSULE | Freq: Every day | ORAL | 0 refills | Status: DC | PRN

## 2021-08-10 ENCOUNTER — Encounter: Payer: Self-pay | Admitting: Nurse Practitioner

## 2021-08-10 ENCOUNTER — Ambulatory Visit (INDEPENDENT_AMBULATORY_CARE_PROVIDER_SITE_OTHER): Payer: 59 | Admitting: Nurse Practitioner

## 2021-08-10 VITALS — BP 130/74 | HR 68 | Ht 68.0 in | Wt 194.1 lb

## 2021-08-10 DIAGNOSIS — D509 Iron deficiency anemia, unspecified: Secondary | ICD-10-CM

## 2021-08-10 DIAGNOSIS — Z8679 Personal history of other diseases of the circulatory system: Secondary | ICD-10-CM | POA: Diagnosis not present

## 2021-08-10 DIAGNOSIS — Z6829 Body mass index (BMI) 29.0-29.9, adult: Secondary | ICD-10-CM

## 2021-08-10 DIAGNOSIS — Z0001 Encounter for general adult medical examination with abnormal findings: Secondary | ICD-10-CM

## 2021-08-10 DIAGNOSIS — I10 Essential (primary) hypertension: Secondary | ICD-10-CM | POA: Diagnosis not present

## 2021-08-10 DIAGNOSIS — K224 Dyskinesia of esophagus: Secondary | ICD-10-CM | POA: Diagnosis not present

## 2021-08-10 MED ORDER — METOPROLOL TARTRATE 25 MG PO TABS: 25.0000 mg | ORAL_TABLET | Freq: Two times a day (BID) | ORAL | 1 refills | Status: DC

## 2021-08-10 MED ORDER — OMEPRAZOLE 40 MG PO CPDR: 40.0000 mg | DELAYED_RELEASE_CAPSULE | Freq: Every day | ORAL | 1 refills | Status: DC | PRN
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
## 2021-08-19 DIAGNOSIS — D509 Iron deficiency anemia, unspecified: Secondary | ICD-10-CM | POA: Insufficient documentation

## 2021-08-19 DIAGNOSIS — Z6829 Body mass index (BMI) 29.0-29.9, adult: Secondary | ICD-10-CM | POA: Insufficient documentation

## 2021-08-25 ENCOUNTER — Telehealth: Payer: Self-pay | Admitting: Nurse Practitioner

## 2021-08-25 ENCOUNTER — Other Ambulatory Visit: Payer: Self-pay

## 2021-08-25 DIAGNOSIS — K224 Dyskinesia of esophagus: Secondary | ICD-10-CM

## 2021-08-25 MED ORDER — OMEPRAZOLE 40 MG PO CPDR: 40.0000 mg | DELAYED_RELEASE_CAPSULE | Freq: Every day | ORAL | 1 refills | Status: DC | PRN

## 2021-08-25 NOTE — Telephone Encounter (Signed)
Patient requesting refill of Prilosec. Please advise.

## 2021-08-25 NOTE — Telephone Encounter (Signed)
Rx was sent  

## 2021-09-13 ENCOUNTER — Ambulatory Visit (INDEPENDENT_AMBULATORY_CARE_PROVIDER_SITE_OTHER): Payer: 59

## 2021-09-13 ENCOUNTER — Encounter: Payer: Self-pay | Admitting: Sports Medicine

## 2021-09-13 ENCOUNTER — Ambulatory Visit: Payer: 59 | Admitting: Sports Medicine

## 2021-09-13 VITALS — Ht 67.0 in | Wt 205.0 lb

## 2021-09-13 DIAGNOSIS — M25571 Pain in right ankle and joints of right foot: Secondary | ICD-10-CM

## 2021-09-13 DIAGNOSIS — S92214A Nondisplaced fracture of cuboid bone of right foot, initial encounter for closed fracture: Secondary | ICD-10-CM
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
## 2021-09-13 NOTE — Progress Notes (Signed)
Injury to ankle 1 week ago; rolled ankle off the steps. Immediate swelling in the ankle, bruising appeared the next day. Wrapping the ankle; provides some relief. Ibuprofen for pain, helps some.

## 2021-09-27 ENCOUNTER — Ambulatory Visit: Payer: 59 | Admitting: Sports Medicine

## 2021-09-27 ENCOUNTER — Ambulatory Visit (INDEPENDENT_AMBULATORY_CARE_PROVIDER_SITE_OTHER): Payer: 59

## 2021-09-27 ENCOUNTER — Encounter: Payer: Self-pay | Admitting: Sports Medicine

## 2021-09-27 DIAGNOSIS — S92214D Nondisplaced fracture of cuboid bone of right foot, subsequent encounter for fracture with routine healing: Secondary | ICD-10-CM | POA: Diagnosis not present

## 2021-09-27 DIAGNOSIS — M25571 Pain in right ankle and joints of right foot: Secondary | ICD-10-CM
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
## 2021-10-27 ENCOUNTER — Ambulatory Visit (INDEPENDENT_AMBULATORY_CARE_PROVIDER_SITE_OTHER): Payer: 59 | Admitting: Sports Medicine

## 2021-10-27 ENCOUNTER — Ambulatory Visit (INDEPENDENT_AMBULATORY_CARE_PROVIDER_SITE_OTHER): Payer: 59

## 2021-10-27 ENCOUNTER — Encounter: Payer: Self-pay | Admitting: Sports Medicine

## 2021-10-27 DIAGNOSIS — S92214D Nondisplaced fracture of cuboid bone of right foot, subsequent encounter for fracture with routine healing: Secondary | ICD-10-CM

## 2021-10-27 DIAGNOSIS — M25571 Pain in right ankle and joints of right foot: Secondary | ICD-10-CM

## 2021-10-27 NOTE — Progress Notes (Signed)
Out of the boot. Doing ok, minimal pain
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
## 2021-12-12 NOTE — Progress Notes (Unsigned)
Established patient visit   Patient: Timothy Gardner   DOB: 04-10-1985   36 y.o. Male  MRN: [id number] Visit Date: 12/13/2021   No chief complaint on file.  Subjective    HPI  Follow up  -hypertension -chronic anemia -esophageal dysmotility -hx aortic dissection -?flu vaccine  Medications: Outpatient Medications Prior to Visit  Medication Sig   ibuprofen (CHILD IBUPROFEN) 100 MG/5ML suspension Take 30 mLs (600 mg total) by mouth every 6 (six) hours as needed for mild pain or moderate pain.   metoprolol tartrate (LOPRESSOR) 25 MG tablet Take 1 tablet (25 mg total) by mouth 2 (two) times daily. Take 1 tablet po BID   omeprazole (PRILOSEC) 40 MG capsule Take 1 capsule (40 mg total) by mouth daily as needed.   No facility-administered medications prior to visit.    Review of Systems  {Labs (Optional):23779}   Objective    There were no vitals filed for this visit. There is no height or weight on file to calculate BMI.  BP Readings from Last 3 Encounters:  08/10/21 130/74  03/09/21 (Abnormal) 151/80  02/03/20 (Abnormal) 155/94    Wt Readings from Last 3 Encounters:  09/13/21 205 lb (93 kg)  08/10/21 194 lb 1.9 oz (88.1 kg)  03/09/21 192 lb 6.4 oz (87.3 kg)    Physical Exam  ***  No results found for any visits on 12/13/21.  Assessment & Plan     Problem List Items Addressed This Visit   None    No follow-ups on file.         Carlean Jews, NP  [facility] [phone] (phone) [phone] (fax)  [facility]

## 2021-12-13 ENCOUNTER — Encounter: Payer: Self-pay | Admitting: Nurse Practitioner

## 2021-12-13 ENCOUNTER — Ambulatory Visit: Payer: 59 | Admitting: Nurse Practitioner

## 2021-12-13 VITALS — BP 149/78 | HR 78 | Ht 67.0 in | Wt 214.4 lb

## 2021-12-13 DIAGNOSIS — I10 Essential (primary) hypertension: Secondary | ICD-10-CM

## 2021-12-13 DIAGNOSIS — D509 Iron deficiency anemia, unspecified: Secondary | ICD-10-CM | POA: Diagnosis not present

## 2021-12-13 DIAGNOSIS — Z6833 Body mass index (BMI) 33.0-33.9, adult: Secondary | ICD-10-CM | POA: Insufficient documentation

## 2021-12-13 DIAGNOSIS — K224 Dyskinesia of esophagus: Secondary | ICD-10-CM | POA: Diagnosis not present

## 2021-12-13 MED ORDER — OMEPRAZOLE 40 MG PO CPDR: 40.0000 mg | DELAYED_RELEASE_CAPSULE | Freq: Every day | ORAL | 1 refills | Status: DC | PRN

## 2021-12-13 MED ORDER — METOPROLOL TARTRATE 25 MG PO TABS: 25.0000 mg | ORAL_TABLET | Freq: Two times a day (BID) | ORAL | 1 refills | Status: DC

## 2021-12-14 LAB — CBC
Hematocrit: 34.3 % — ABNORMAL LOW (ref 37.5–51.0)
Hemoglobin: 9.5 g/dL — ABNORMAL LOW (ref 13.0–17.7)
MCH: 16 pg — ABNORMAL LOW (ref 26.6–33.0)
MCHC: 27.7 g/dL — ABNORMAL LOW (ref 31.5–35.7)
MCV: 58 fL — ABNORMAL LOW (ref 79–97)
Platelets: 344 10*3/uL (ref 150–450)
RBC: 5.92 x10E6/uL — ABNORMAL HIGH (ref 4.14–5.80)
RDW: 20.9 % — ABNORMAL HIGH (ref 11.6–15.4)
WBC: 8 10*3/uL (ref 3.4–10.8)

## 2022-01-05 DIAGNOSIS — Z3009 Encounter for other general counseling and advice on contraception: Secondary | ICD-10-CM | POA: Diagnosis not present

## 2022-02-25 ENCOUNTER — Encounter: Payer: Self-pay | Admitting: Sports Medicine

## 2022-02-25 ENCOUNTER — Ambulatory Visit (INDEPENDENT_AMBULATORY_CARE_PROVIDER_SITE_OTHER): Payer: 59 | Admitting: Sports Medicine

## 2022-02-25 ENCOUNTER — Ambulatory Visit (INDEPENDENT_AMBULATORY_CARE_PROVIDER_SITE_OTHER): Payer: 59

## 2022-02-25 DIAGNOSIS — M9902 Segmental and somatic dysfunction of thoracic region: Secondary | ICD-10-CM | POA: Diagnosis not present

## 2022-02-25 DIAGNOSIS — M542 Cervicalgia: Secondary | ICD-10-CM

## 2022-02-25 DIAGNOSIS — M9901 Segmental and somatic dysfunction of cervical region: Secondary | ICD-10-CM

## 2022-02-25 DIAGNOSIS — Z2989 Encounter for other specified prophylactic measures: Secondary | ICD-10-CM | POA: Diagnosis not present
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
## 2022-03-01 DIAGNOSIS — Z302 Encounter for sterilization: Secondary | ICD-10-CM | POA: Diagnosis not present

## 2022-06-06 ENCOUNTER — Ambulatory Visit (INDEPENDENT_AMBULATORY_CARE_PROVIDER_SITE_OTHER): Payer: 59 | Admitting: Nurse Practitioner

## 2022-06-06 DIAGNOSIS — E785 Hyperlipidemia, unspecified: Secondary | ICD-10-CM

## 2022-06-06 DIAGNOSIS — I10 Essential (primary) hypertension: Secondary | ICD-10-CM

## 2022-06-06 DIAGNOSIS — Z91198 Patient's noncompliance with other medical treatment and regimen for other reason: Secondary | ICD-10-CM

## 2022-06-06 DIAGNOSIS — D509 Iron deficiency anemia, unspecified: Secondary | ICD-10-CM

## 2022-06-06 DIAGNOSIS — R5383 Other fatigue: Secondary | ICD-10-CM

## 2022-06-06 NOTE — Progress Notes (Signed)
Established patient visit   Patient: SINJIN AMERO   DOB: 05-08-1985   37 y.o. Male  MRN: [id number] Visit Date: 06/06/2022   No chief complaint on file.  Subjective    HPI  Follow-up -Hypertension --Currently on metoprolol 25 mg twice daily.  Historically has had difficulty remembering second dose.  Unable to swallow ER formulation of medication. -History of iron deficiency anemia. --Chronic anemia.  Most recent CBC done 12/22/2021 showing hemoglobin of 9.5 and hematocrit of 34.3. -Esophageal dysmotility. -Due to have routine, fasting labs.  Medications: Outpatient Medications Prior to Visit  Medication Sig   ibuprofen (CHILD IBUPROFEN) 100 MG/5ML suspension Take 30 mLs (600 mg total) by mouth every 6 (six) hours as needed for mild pain or moderate pain.   metoprolol tartrate (LOPRESSOR) 25 MG tablet Take 1 tablet (25 mg total) by mouth 2 (two) times daily. Take 1 tablet po BID   omeprazole (PRILOSEC) 40 MG capsule Take 1 capsule (40 mg total) by mouth daily as needed.   No facility-administered medications prior to visit.    Review of Systems     Objective    There were no vitals filed for this visit. There is no height or weight on file to calculate BMI.  BP Readings from Last 3 Encounters:  12/13/21 (Abnormal) 149/78  08/10/21 130/74  03/09/21 (Abnormal) 151/80    Wt Readings from Last 3 Encounters:  12/13/21 214 lb 6.4 oz (97.3 kg)  09/13/21 205 lb (93 kg)  08/10/21 194 lb 1.9 oz (88.1 kg)    Physical Exam    No results found for any visits on 06/06/22.  Assessment & Plan    Failure to attend appointment with reason given  Iron deficiency anemia, unspecified iron deficiency anemia type  Essential hypertension  Other fatigue  Hyperlipidemia LDL goal <100     Return in about 4 months (around 10/06/2022) for health maintenance exam.         Carlean Jews, NP  [facility] at [facility] [phone] (phone) [phone]  (fax)  [facility]

## 2022-06-16 ENCOUNTER — Ambulatory Visit (INDEPENDENT_AMBULATORY_CARE_PROVIDER_SITE_OTHER): Payer: 59 | Admitting: Nurse Practitioner

## 2022-06-16 DIAGNOSIS — R5383 Other fatigue: Secondary | ICD-10-CM

## 2022-06-16 DIAGNOSIS — Z91199 Patient's noncompliance with other medical treatment and regimen due to unspecified reason: Secondary | ICD-10-CM

## 2022-06-16 DIAGNOSIS — E785 Hyperlipidemia, unspecified: Secondary | ICD-10-CM

## 2022-06-16 DIAGNOSIS — D509 Iron deficiency anemia, unspecified: Secondary | ICD-10-CM

## 2022-06-16 DIAGNOSIS — R7301 Impaired fasting glucose: Secondary | ICD-10-CM

## 2022-06-16 DIAGNOSIS — I10 Essential (primary) hypertension: Secondary | ICD-10-CM

## 2022-06-16 NOTE — Progress Notes (Signed)
Established patient visit   Patient: Timothy Gardner   DOB: August 11, 1985   37 y.o. Male  MRN: [id number] Visit Date: 06/16/2022   No chief complaint on file.  Subjective    HPI  HPI  Follow-up -Hypertension --Currently on metoprolol 25 mg twice daily.  Historically has had difficulty remembering second dose.  Unable to swallow ER formulation of medication. -History of iron deficiency anemia. --Chronic anemia.  Most recent CBC done 12/22/2021 showing hemoglobin of 9.5 and hematocrit of 34.3. -Esophageal dysmotility. -Due to have routine, fasting labs.   Medications: Outpatient Medications Prior to Visit  Medication Sig   ibuprofen (CHILD IBUPROFEN) 100 MG/5ML suspension Take 30 mLs (600 mg total) by mouth every 6 (six) hours as needed for mild pain or moderate pain.   metoprolol tartrate (LOPRESSOR) 25 MG tablet Take 1 tablet (25 mg total) by mouth 2 (two) times daily. Take 1 tablet po BID   omeprazole (PRILOSEC) 40 MG capsule Take 1 capsule (40 mg total) by mouth daily as needed.   No facility-administered medications prior to visit.    Review of Systems     Objective    There were no vitals filed for this visit. There is no height or weight on file to calculate BMI.  BP Readings from Last 3 Encounters:  12/13/21 (Abnormal) 149/78  08/10/21 130/74  03/09/21 (Abnormal) 151/80    Wt Readings from Last 3 Encounters:  12/13/21 214 lb 6.4 oz (97.3 kg)  09/13/21 205 lb (93 kg)  08/10/21 194 lb 1.9 oz (88.1 kg)    Physical Exam    No results found for any visits on 06/16/22.  Assessment & Plan    Essential hypertension -     Comprehensive metabolic panel; Future  Iron deficiency anemia, unspecified iron deficiency anemia type -     CBC with Differential/Platelet; Future -     Ferritin; Future -     Vitamin B12; Future  Other fatigue -     CBC with Differential/Platelet; Future -     TSH + free T4; Future -     Ferritin; Future -     Vitamin B12;  Future  Hyperlipidemia LDL goal <100 -     Comprehensive metabolic panel; Future -     Lipid panel; Future -     TSH + free T4; Future  Impaired fasting glucose -     Hemoglobin A1c; Future -     TSH + free T4; Future  No-show for appointment     No follow-ups on file.         Carlean Jews, NP  [facility] at [facility] [phone] (phone) [phone] (fax)  [facility]

## 2022-08-22 ENCOUNTER — Other Ambulatory Visit: Payer: Self-pay | Admitting: Nurse Practitioner

## 2022-08-22 DIAGNOSIS — I10 Essential (primary) hypertension: Secondary | ICD-10-CM

## 2022-08-23 ENCOUNTER — Telehealth: Payer: Self-pay | Admitting: *Deleted

## 2022-08-23 ENCOUNTER — Other Ambulatory Visit: Payer: Self-pay | Admitting: Family Medicine

## 2022-08-23 DIAGNOSIS — K224 Dyskinesia of esophagus: Secondary | ICD-10-CM

## 2022-08-23 DIAGNOSIS — I10 Essential (primary) hypertension: Secondary | ICD-10-CM

## 2022-08-23 MED ORDER — METOPROLOL TARTRATE 25 MG PO TABS
25.0000 mg | ORAL_TABLET | Freq: Two times a day (BID) | ORAL | 1 refills | Status: DC
Start: 2022-08-23 — End: 2022-12-07

## 2022-08-23 MED ORDER — OMEPRAZOLE 40 MG PO CPDR
40.0000 mg | DELAYED_RELEASE_CAPSULE | Freq: Every day | ORAL | 1 refills | Status: DC | PRN
Start: 2022-08-23 — End: 2022-11-07

## 2022-08-23 NOTE — Telephone Encounter (Signed)
Sent in refill for 30 days w/ 1 refill.

## 2022-08-23 NOTE — Telephone Encounter (Signed)
Calling office to see about getting refills on below.  Pt has upcoming appointment in October and would like enough medication to last until that appointment.     LOV 12/13/21 ROV 10/05/2022      metoprolol tartrate (LOPRESSOR) 25 MG tablet only has 13 tablets left    omeprazole (PRILOSEC) 40 MG capsule       [facility] - [place], [place] - [address]  [address] [place] [address]

## 2022-10-05 ENCOUNTER — Encounter: Payer: Self-pay | Admitting: Family Medicine

## 2022-10-05 ENCOUNTER — Ambulatory Visit (INDEPENDENT_AMBULATORY_CARE_PROVIDER_SITE_OTHER): Payer: 59 | Admitting: Family Medicine

## 2022-10-05 ENCOUNTER — Encounter: Payer: 59 | Admitting: Nurse Practitioner

## 2022-10-05 VITALS — BP 151/89 | HR 65 | Ht 67.0 in | Wt 192.8 lb

## 2022-10-05 DIAGNOSIS — D509 Iron deficiency anemia, unspecified: Secondary | ICD-10-CM

## 2022-10-05 DIAGNOSIS — I10 Essential (primary) hypertension: Secondary | ICD-10-CM

## 2022-10-05 DIAGNOSIS — R1310 Dysphagia, unspecified: Secondary | ICD-10-CM | POA: Diagnosis not present

## 2022-10-05 DIAGNOSIS — Z8679 Personal history of other diseases of the circulatory system: Secondary | ICD-10-CM | POA: Diagnosis not present

## 2022-10-05 DIAGNOSIS — Z6830 Body mass index (BMI) 30.0-30.9, adult: Secondary | ICD-10-CM

## 2022-10-05 DIAGNOSIS — K760 Fatty (change of) liver, not elsewhere classified: Secondary | ICD-10-CM | POA: Diagnosis not present

## 2022-10-05 MED ORDER — HYDROCHLOROTHIAZIDE 12.5 MG PO CAPS
12.5000 mg | ORAL_CAPSULE | Freq: Every day | ORAL | 1 refills | Status: DC
Start: 2022-10-05 — End: 2022-12-07
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
## 2022-10-05 NOTE — Assessment & Plan Note (Signed)
Blood pressure elevated.  Patient currently only taking metoprolol twice daily at a low dose.  States that his blood pressure readings at home are also high.  He chews his metoprolol which may be affecting their effectiveness.  We looked into alternative medications and he is agreeable to Microzide capsules.  Discussed taking 1 capsule daily and checking his blood pressure at home and if still elevated increasing to 2 capsules after 2 weeks.  Following back up with me in 1 month for blood pressure recheck.

## 2022-10-05 NOTE — Assessment & Plan Note (Signed)
Blood pressure control will need to be more strict to prevent recurrence.  Followed up with his thoracic surgeon 1 time postop but did not attend any further follow-ups.  Recommended patient reach out to thoracic surgeon again and follow-up with them at least 1 more time regarding this.  He denies any current symptoms or sequelae from the initial dissection.
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
## 2022-10-05 NOTE — Patient Instructions (Signed)
It was nice to see you today,  We addressed the following topics today: -I have prescribed you medication called Microzide that is a capsule.  This is for your blood pressure. - I would like you to take his medicine daily and then check your blood pressure at home.  If after 2 weeks it is still elevated, increase it to 2 capsules a day. - Follow-up with me in 1 month so we can discuss your blood pressure again and recheck it here. - I will recheck your anemia test and we will talk about further workup if necessary - It would be a good idea to reach out to your thoracic surgeon for 1 more follow-up visit with them.   Have a great day,  Frederic Jericho, MD

## 2022-10-05 NOTE — Assessment & Plan Note (Signed)
Patient states he has been worked up for dysphagia in the past and has difficulty swallowing pills he currently chews his pills or crushes them or uses capsules that he mixes in liquid.  Dysphagia does not seem to bother him outside of swallowing pills.  No issues with swallowing solid food or liquids.
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
## 2022-10-06 LAB — COMPREHENSIVE METABOLIC PANEL
ALT: 35 [IU]/L (ref 0–44)
AST: 26 [IU]/L (ref 0–40)
Albumin: 4.5 g/dL (ref 4.1–5.1)
Alkaline Phosphatase: 109 [IU]/L (ref 44–121)
BUN/Creatinine Ratio: 12 (ref 9–20)
BUN: 10 mg/dL (ref 6–20)
Bilirubin Total: 0.3 mg/dL (ref 0.0–1.2)
CO2: 23 mmol/L (ref 20–29)
Calcium: 9.4 mg/dL (ref 8.7–10.2)
Chloride: 103 mmol/L (ref 96–106)
Creatinine, Ser: 0.85 mg/dL (ref 0.76–1.27)
Globulin, Total: 2.9 g/dL (ref 1.5–4.5)
Glucose: 88 mg/dL (ref 70–99)
Potassium: 4.5 mmol/L (ref 3.5–5.2)
Sodium: 141 mmol/L (ref 134–144)
Total Protein: 7.4 g/dL (ref 6.0–8.5)
eGFR: 115 mL/min/{1.73_m2} (ref >59)

## 2022-10-06 LAB — CBC
Hematocrit: 37.2 % — ABNORMAL LOW (ref 37.5–51.0)
Hemoglobin: 9.9 g/dL — ABNORMAL LOW (ref 13.0–17.7)
MCH: 16.8 pg — ABNORMAL LOW (ref 26.6–33.0)
MCHC: 26.6 g/dL — ABNORMAL LOW (ref 31.5–35.7)
MCV: 63 fL — ABNORMAL LOW (ref 79–97)
Platelets: 369 10*3/uL (ref 150–450)
RBC: 5.91 x10E6/uL — ABNORMAL HIGH (ref 4.14–5.80)
RDW: 21.7 % — ABNORMAL HIGH (ref 11.6–15.4)
WBC: 7.3 10*3/uL (ref 3.4–10.8)

## 2022-10-06 LAB — IRON,TIBC AND FERRITIN PANEL
Ferritin: 5 ng/mL — ABNORMAL LOW (ref 30–400)
Iron Saturation: 4 % — CL (ref 15–55)
Iron: 20 ug/dL — ABNORMAL LOW (ref 38–169)
Total Iron Binding Capacity: 460 ug/dL — ABNORMAL HIGH (ref 250–450)
UIBC: 440 ug/dL — ABNORMAL HIGH (ref 111–343)

## 2022-10-06 LAB — LIPID PANEL
Chol/HDL Ratio: 3.1 {ratio} (ref 0.0–5.0)
Cholesterol, Total: 143 mg/dL (ref 100–199)
HDL: 46 mg/dL (ref >39)
LDL Chol Calc (NIH): 75 mg/dL (ref 0–99)
Triglycerides: 121 mg/dL (ref 0–149)
VLDL Cholesterol Cal: 22 mg/dL (ref 5–40)

## 2022-10-06 LAB — HEMOGLOBIN A1C
Est. average glucose Bld gHb Est-mCnc: 117 mg/dL
Hgb A1c MFr Bld: 5.7 % — ABNORMAL HIGH (ref 4.8–5.6)

## 2022-10-31 NOTE — Progress Notes (Signed)
For your review

## 2022-11-04 ENCOUNTER — Other Ambulatory Visit: Payer: Self-pay | Admitting: Family Medicine

## 2022-11-04 DIAGNOSIS — K224 Dyskinesia of esophagus: Secondary | ICD-10-CM

## 2022-11-09 ENCOUNTER — Ambulatory Visit (INDEPENDENT_AMBULATORY_CARE_PROVIDER_SITE_OTHER): Payer: 59 | Admitting: Family Medicine

## 2022-11-09 ENCOUNTER — Encounter: Payer: Self-pay | Admitting: Family Medicine

## 2022-11-09 VITALS — BP 168/83 | HR 72 | Ht 67.0 in | Wt 196.0 lb

## 2022-11-09 DIAGNOSIS — I10 Essential (primary) hypertension: Secondary | ICD-10-CM | POA: Diagnosis not present

## 2022-11-09 DIAGNOSIS — D509 Iron deficiency anemia, unspecified: Secondary | ICD-10-CM

## 2022-11-09 MED ORDER — AMLODIPINE BESYLATE 5 MG PO TABS: 5.0000 mg | ORAL_TABLET | Freq: Every day | ORAL | 3 refills | Status: AC

## 2022-11-09 NOTE — Patient Instructions (Addendum)
It was nice to see you today,  We addressed the following topics today: -I have added amlodipine 5 mg.  Crush this up and take it in the morning.  Also increase your Microzide to 2 capsules in the mornings.  If after 2 weeks, this does not improve your blood pressure to at least less than 140/90, increase the amlodipine to 2 tablets in the morning - Follow-up with me in 1 month - I have sent in a hematology referral for you.  Someone will call you with this - Your previous cardiothoracic surgeon appears to be located at [facility].  Call them to schedule a follow-up appointment if appropriate.  Have a great day,  Frederic Jericho, MD

## 2022-11-09 NOTE — Assessment & Plan Note (Signed)
Patient's blood pressure still elevated.  Home readings confirm this.  Is taking metoprolol for last several years.  Recently added Microzide.  He did not increase to 25 mg.  We discussed increasing by taking 2 of his capsules daily.  We also discussed other alternatives.  Best option appears to be tablets that he can crush up. - Amlodipine 5 mg, crush to take p.o. - Increase Microzide to 2 capsules a day. - Continue to monitor blood pressure.  If the above changes do not improve blood pressure then he is to increase the amlodipine to 10 mg a day. - Follow-up with me in 1 month.

## 2022-11-09 NOTE — Assessment & Plan Note (Signed)
Low ferritin, low iron, low iron saturation.  Anemia predates his aortic dissection in 2021.  Has dysphagia but still has a regular diet.  Is not restricted in his dietary habits. - Hematology referral - Continue p.o. iron - Will reach out again to see if we can get endoscopy results.  Colonoscopy results were normal.  This is found in the media tab.

## 2022-11-09 NOTE — Progress Notes (Signed)
Established Patient Office Visit  Subjective   Patient ID: Timothy Gardner, male    DOB: 07-Dec-1985  Age: 37 y.o. MRN: [id number]  Chief Complaint  Patient presents with   Medical Management of Chronic Issues    HPI Hypertension-patient states he still takes his metoprolol once a day.  Takes the Microzide once a day as well.  Did not increase it to 2 capsules.  His wife is accompanying him and she read off some of his blood pressure readings at home, all of them were elevated in the 150s or higher.  They asked if his blood pressure should be higher due to his history of aortic dissection.  Discussed how high blood pressure can weaken the walls of the aorta and trigger or cause dissections.  Anemia-we discussed his hemoglobin and iron studies.  We discussed his results of previous colonoscopy.  He states he has had an endoscopy as well but we did not receive records of this with the colonoscopy results.  Will reach out again.  Patient agreeable to hematology referral.  We discussed healing of IV iron.  He was been taking oral iron crushed up for a few months now.    The ASCVD Risk score (Arnett DK, et al., 2019) failed to calculate for the following reasons:   The 2019 ASCVD risk score is only valid for ages 68 to 68  Health Maintenance Due  Topic Date Due   Hepatitis C Screening  Never done   INFLUENZA VACCINE  08/04/2022   COVID-19 Vaccine (1 - 2023-24 season) Never done      Objective:     BP (!) 168/83   Pulse 72   Ht 5\' 7"  (1.702 m)   Wt 196 lb (88.9 kg)   SpO2 99%   BMI 30.70 kg/m    Physical Exam General: Alert, oriented CV: Regular rate rhythm no murmurs Pulmonary: Lungs clear bilaterally Psych: Pleasant affect   No results found for any visits on 11/09/22.      Assessment & Plan:   Iron deficiency anemia, unspecified iron deficiency anemia type Assessment & Plan: Low ferritin, low iron, low iron saturation.  Anemia predates his aortic dissection in  2021.  Has dysphagia but still has a regular diet.  Is not restricted in his dietary habits. - Hematology referral - Continue p.o. iron - Will reach out again to see if we can get endoscopy results.  Colonoscopy results were normal.  This is found in the media tab.  Orders: -     Ambulatory referral to Hematology / Oncology  Essential hypertension Assessment & Plan: Patient's blood pressure still elevated.  Home readings confirm this.  Is taking metoprolol for last several years.  Recently added Microzide.  He did not increase to 25 mg.  We discussed increasing by taking 2 of his capsules daily.  We also discussed other alternatives.  Best option appears to be tablets that he can crush up. - Amlodipine 5 mg, crush to take p.o. - Increase Microzide to 2 capsules a day. - Continue to monitor blood pressure.  If the above changes do not improve blood pressure then he is to increase the amlodipine to 10 mg a day. - Follow-up with me in 1 month.  Orders: -     amLODIPine Besylate; Take 1 tablet (5 mg total) by mouth daily.  Dispense: 90 tablet; Refill: 3     Return in about 4 weeks (around 12/07/2022) for HTN.    Sandre Kitty,  MD

## 2022-12-07 ENCOUNTER — Ambulatory Visit (INDEPENDENT_AMBULATORY_CARE_PROVIDER_SITE_OTHER): Payer: 59 | Admitting: Family Medicine

## 2022-12-07 ENCOUNTER — Other Ambulatory Visit: Payer: Self-pay | Admitting: Family Medicine

## 2022-12-07 ENCOUNTER — Encounter: Payer: Self-pay | Admitting: Family Medicine

## 2022-12-07 VITALS — BP 143/90 | HR 80 | Ht 67.0 in | Wt 198.8 lb

## 2022-12-07 DIAGNOSIS — I10 Essential (primary) hypertension: Secondary | ICD-10-CM

## 2022-12-07 DIAGNOSIS — K224 Dyskinesia of esophagus: Secondary | ICD-10-CM

## 2022-12-07 DIAGNOSIS — D509 Iron deficiency anemia, unspecified: Secondary | ICD-10-CM

## 2022-12-07 MED ORDER — HYDROCHLOROTHIAZIDE 12.5 MG PO CAPS: 25.0000 mg | ORAL_CAPSULE | Freq: Every day | ORAL | 3 refills | Status: AC

## 2022-12-07 MED ORDER — SPIRONOLACTONE 25 MG PO TABS
25.0000 mg | ORAL_TABLET | Freq: Every day | ORAL | 3 refills | Status: AC
Start: 2022-12-07 — End: ?

## 2022-12-07 NOTE — Progress Notes (Signed)
   Established Patient Office Visit  Subjective   Patient ID: Timothy Gardner, male    DOB: [birth year]  Age: 37 y.o. MRN: [id number]  Chief Complaint  Patient presents with   Medical Management of Chronic Issues    HPI Hypertension-patient is taking amlodipine and 1 capsule of Microzide.  We discussed increasing the Microzide dose to 2 capsules.  Also adding spironolactone chewable.  The oral suspension was too expensive.  Patient agreeable to this plan.  Anemia-patient has a appointment with the hematologist later this month.  We discussed resending the records request for the endoscopy.  Patient does not tolerate iron tablets.  Recommended trying liquid iron.  If these fail he may need IV iron.  Normal   The ASCVD Risk score (Arnett DK, et al., 2019) failed to calculate for the following reasons:   The 2019 ASCVD risk score is only valid for ages 2 to 38  Health Maintenance Due  Topic Date Due   Hepatitis C Screening  Never done   INFLUENZA VACCINE  08/04/2022   COVID-19 Vaccine (1 - 2023-24 season) Never done      Objective:     BP (!) 143/90   Pulse 80   Ht 5\' 7"  (1.702 m)   Wt 198 lb 12.8 oz (90.2 kg)   SpO2 98%   BMI 31.14 kg/m    Physical Exam General: Alert and oriented CV: Regular in rhythm Pulmonary: lungs clear Bilaterally.   No results found for any visits on 12/07/22.      Assessment & Plan:   Essential hypertension Assessment & Plan: Patient taking the amlodipine and 1 capsule of Microzide.  At previous visit had recommended increasing to 2 capsules of Microzide but I did not write a new prescription at the time.  Patient has not increased it, therefore I will send in a new prescription as 2 capsules/day.  Also adding spironolactone 25 mg.  Patient will crush the amlodipine and spironolactone.  Liquid formulation of spironolactone cost prohibitive.  Orders: -     hydroCHLOROthiazide; Take 2 capsules (25 mg total) by mouth daily.  Dispense: 60  capsule; Refill: 3  Iron deficiency anemia, unspecified iron deficiency anemia type Assessment & Plan: >>ASSESSMENT AND PLAN FOR MICROCYTIC ANEMIA WRITTEN ON 12/07/2022 11:28 AM BY Sandre Kitty, MD  Patient has appointment with hematology on the 16th of this month.  Encourage patient to try to continue with oral iron and to try liquid formulation.   Other orders -     Spironolactone; Take 1 tablet (25 mg total) by mouth daily.  Dispense: 90 tablet; Refill: 3     Return in about 5 weeks (around 01/11/2023) for HTN.    Sandre Kitty, MD

## 2022-12-07 NOTE — Assessment & Plan Note (Signed)
Patient has appointment with hematology on the 16th of this month.  Encourage patient to try to continue with oral iron and to try liquid formulation.

## 2022-12-07 NOTE — Assessment & Plan Note (Signed)
>>  ASSESSMENT AND PLAN FOR MICROCYTIC ANEMIA WRITTEN ON 12/07/2022 11:28 AM BY Sandre Kitty, MD  Patient has appointment with hematology on the 16th of this month.  Encourage patient to try to continue with oral iron and to try liquid formulation.

## 2022-12-07 NOTE — Patient Instructions (Signed)
It was nice to see you today,  We addressed the following topics today: -I have added spironolactone.  Take this once a day in the morning and you can crush it up. - I have sent in the increased dose of Microzide.  Take 2 capsules of your Microzide daily. - You should be taking 2 capsules of Microzide, 1 tablet of amlodipine, and 1 tablet of spironolactone.  If you feel like your blood pressure is "too low" while taking these you can go back to taking 1 capsule of Microzide daily - I will resubmit records request for your endoscopy.  Have a great day,  Frederic Jericho, MD

## 2022-12-07 NOTE — Assessment & Plan Note (Signed)
Patient taking the amlodipine and 1 capsule of Microzide.  At previous visit had recommended increasing to 2 capsules of Microzide but I did not write a new prescription at the time.  Patient has not increased it, therefore I will send in a new prescription as 2 capsules/day.  Also adding spironolactone 25 mg.  Patient will crush the amlodipine and spironolactone.  Liquid formulation of spironolactone cost prohibitive.
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
## 2022-12-19 ENCOUNTER — Inpatient Hospital Stay: Payer: 59

## 2022-12-31 ENCOUNTER — Other Ambulatory Visit: Payer: Self-pay | Admitting: Family Medicine

## 2022-12-31 DIAGNOSIS — K224 Dyskinesia of esophagus: Secondary | ICD-10-CM

## 2023-01-12 ENCOUNTER — Ambulatory Visit: Payer: 59 | Admitting: Family Medicine

## 2023-01-12 NOTE — Progress Notes (Deleted)
   Established Patient Office Visit  Subjective   Patient ID: Timothy Gardner, male    DOB: 1985/03/24  Age: 38 y.o. MRN: [id number]  No chief complaint on file.   HPI  Hypertension-2 capsules of Microzide , spironolactone  chewable  Anemia-missed the appointment with the hematologist.  Liquid iron?  Why did he miss hematology apopt?   The ASCVD Risk score (Arnett DK, et al., 2019) failed to calculate for the following reasons:   The 2019 ASCVD risk score is only valid for ages 98 to 20  Health Maintenance Due  Topic Date Due   Hepatitis C Screening  Never done   INFLUENZA VACCINE  08/04/2022   COVID-19 Vaccine (1 - 2024-25 season) Never done      Objective:     There were no vitals taken for this visit. {Vitals History (Optional):23777}  Physical Exam   No results found for any visits on 01/12/23.      Assessment & Plan:   There are no diagnoses linked to this encounter.   No follow-ups on file.    Timothy MARLA Slain, MD

## 2023-01-26 ENCOUNTER — Other Ambulatory Visit: Payer: Self-pay | Admitting: Family Medicine

## 2023-01-26 DIAGNOSIS — K224 Dyskinesia of esophagus: Secondary | ICD-10-CM

## 2023-02-20 ENCOUNTER — Other Ambulatory Visit: Payer: Self-pay | Admitting: Family Medicine

## 2023-02-20 DIAGNOSIS — K224 Dyskinesia of esophagus: Secondary | ICD-10-CM

## 2023-07-18 ENCOUNTER — Telehealth: Payer: Self-pay | Admitting: *Deleted

## 2023-07-18 NOTE — Telephone Encounter (Signed)
 Contacted pt to schedule an appt and he stated that he is out of town till sometime in September and he stated that he would call when he gets back to schedule an appt.

## 2024-01-08 ENCOUNTER — Encounter: Payer: Self-pay | Admitting: Orthopedic Surgery

## 2024-01-08 ENCOUNTER — Other Ambulatory Visit (INDEPENDENT_AMBULATORY_CARE_PROVIDER_SITE_OTHER)

## 2024-01-08 ENCOUNTER — Ambulatory Visit: Admitting: Orthopedic Surgery

## 2024-01-08 VITALS — BP 166/104 | HR 88 | Ht 67.0 in | Wt 198.0 lb

## 2024-01-08 DIAGNOSIS — M542 Cervicalgia: Secondary | ICD-10-CM

## 2024-01-08 NOTE — Progress Notes (Signed)
 Orthopedic Spine Surgery Office Note  Assessment: Patient is a 39 y.o. 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 Discussed that the conservative treatments include:  -activity modification  -physical therapy  -over the counter pain medications  -medrol  dosepak  -cervical steroid injections -Patient has tried chiropractor, ibuprofen  -Recommended continue with chiropractic treatment since that has been helping.  For these episodes where the pain is particularly severe, would prescribe him a Medrol  Dosepak.  If pain becomes more constant, would recommend an MRI and talk about injections as a next treatment option -Patient should return to office on an as needed basis   Patient expressed understanding of the plan and all questions were answered to the patient's satisfaction.   ___________________________________________________________________________   History:  Patient is a 39 y.o. male who presents today for cervical spine.  Patient has had neck pain for several years.  At first it was on the left side of the neck.  However, within the last year has been more on the right side of the neck.  Feels it in the lower cervical region.  He does not have any pain radiating into the upper extremities.  He does notice a knot or feels a knot on the left side of his neck and the cranial portion of the cervical spine.  He said this will come and go.  Right now, he does not feel it.  He has been going to a chiropractor and finds that helpful.  He does notice times though where the pain is really severe and it generally lasts a couple of days and the chiropractor is not enough to help with that.   Weakness: Denies Difficulty with fine motor skills (e.g., buttoning shirts, handwriting): Denies Symptoms of imbalance:  Denies Paresthesias and numbness: Denies Bowel or bladder incontinence: Denies Saddle anesthesia: Denies  Treatments tried: Chiropractic treatment, ibuprofen   Review of systems: Denies fevers and chills, night sweats, unexplained weight loss, history of cancer.  Has had pain that wakes him at night  Past medical history: Aortic dissection Headaches  Allergies: lisinopril   Past surgical history:  Aorta endovascular stent Right shoulder arthroscopy  Social history: Denies use of nicotine product (smoking, vaping, patches, smokeless) Alcohol use: social  Denies recreational drug use   Physical Exam:  BMI of 31.0  General: no acute distress, appears stated age Neurologic: alert, answering questions appropriately, following commands Respiratory: unlabored breathing on room air, symmetric chest rise Psychiatric: appropriate affect, normal cadence to speech   MSK (spine):  -Strength exam      Left  Right Grip strength                5/5  5/5 Interosseus   5/5   5/5 Wrist extension  5/5  5/5 Wrist flexion   5/5  5/5 Elbow flexion   5/5  5/5 Deltoid    5/5  5/5  -Sensory exam    Sensation intact to light touch in C5-T1 nerve distributions of bilateral upper extremities  -Brachioradialis DTR: 2/4 on the left, 2/4 on the right -Biceps DTR: 2/4 on the left, 2/4 on the right  -Spurling: negative bilaterally -Hoffman sign: negative bilaterally -Clonus: no beats bilaterally -Interosseous wasting: none seen -Grip and release test: negative -Romberg: negative -Gait: normal  Left shoulder exam: no pain through range of motion Right shoulder exam: no pain through range  of motion  Full and fluid range of motion through the cervical spine.  Patient did note some pain with extension but not with motion through other planes  Imaging: XRs of the cervical spine from 01/08/2023 were independently reviewed and interpreted, showing joint space narrowing in the C6/7 facet  joints.  No other significant degenerative changes seen.  No evidence of instability on flexion/tension views.  No fracture or dislocation seen.   Patient name: Timothy Gardner Patient MRN: [id number] Date of visit: 01/08/2024

## 2024-01-15 ENCOUNTER — Ambulatory Visit: Admitting: Orthopedic Surgery

## 2024-02-09 ENCOUNTER — Encounter: Payer: Self-pay | Admitting: *Deleted

## 2024-02-09 NOTE — Progress Notes (Signed)
 ALAN DRUMMER                                          MRN: [id number]   02/09/2024   The [facility] Quality Team Specialist reviewed this patient medical record for the purposes of chart review for care gap closure. The following were reviewed: chart review for care gap closure-controlling blood pressure.    [facility] Quality Team
